# Patient Record
Sex: Female | Born: 1970 | Race: White | Hispanic: No | Marital: Married | Smoking: Never smoker
Health system: Southern US, Community
[De-identification: ages and names within clinical notes are randomized; demographics above are authoritative.]

## PROBLEM LIST (undated history)

## (undated) DIAGNOSIS — I10 Essential (primary) hypertension: Secondary | ICD-10-CM

## (undated) HISTORY — PX: CHOLECYSTECTOMY: SHX55

## (undated) HISTORY — DX: Essential (primary) hypertension: I10

## (undated) HISTORY — PX: CHOLECYSTECTOMY, LAPAROSCOPIC: SHX56

## (undated) HISTORY — PX: COSMETIC SURGERY: SHX468

---

## 2014-03-08 HISTORY — PX: AUGMENTATION MAMMAPLASTY: SUR837

## 2018-04-13 ENCOUNTER — Ambulatory Visit (INDEPENDENT_AMBULATORY_CARE_PROVIDER_SITE_OTHER): Payer: No Typology Code available for payment source | Admitting: Family Medicine

## 2018-04-13 ENCOUNTER — Encounter: Payer: Self-pay | Admitting: Family Medicine

## 2018-04-13 VITALS — BP 160/90 | HR 61 | Temp 97.9°F | Wt 127.6 lb

## 2018-04-13 DIAGNOSIS — Z1239 Encounter for other screening for malignant neoplasm of breast: Secondary | ICD-10-CM

## 2018-04-13 DIAGNOSIS — Z111 Encounter for screening for respiratory tuberculosis: Secondary | ICD-10-CM | POA: Diagnosis not present

## 2018-04-13 DIAGNOSIS — R030 Elevated blood-pressure reading, without diagnosis of hypertension: Secondary | ICD-10-CM | POA: Diagnosis not present

## 2018-04-13 DIAGNOSIS — Z0000 Encounter for general adult medical examination without abnormal findings: Secondary | ICD-10-CM

## 2018-04-13 LAB — BASIC METABOLIC PANEL
BUN: 12 mg/dL (ref 6–23)
CO2: 30 mEq/L (ref 19–32)
Calcium: 9.2 mg/dL (ref 8.4–10.5)
Chloride: 103 mEq/L (ref 96–112)
Creatinine, Ser: 0.74 mg/dL (ref 0.40–1.20)
GFR: 84.68 mL/min (ref >60.00)
Glucose, Bld: 81 mg/dL (ref 70–99)
Potassium: 4.3 mEq/L (ref 3.5–5.1)
Sodium: 137 mEq/L (ref 135–145)

## 2018-04-13 LAB — LIPID PANEL
Cholesterol: 197 mg/dL (ref 0–200)
HDL: 83.9 mg/dL (ref >39.00)
LDL Cholesterol: 101 mg/dL — ABNORMAL HIGH (ref 0–99)
NonHDL: 112.76
Total CHOL/HDL Ratio: 2
Triglycerides: 60 mg/dL (ref 0.0–149.0)
VLDL: 12 mg/dL (ref 0.0–40.0)

## 2018-04-13 LAB — ALT: ALT: 16 U/L (ref 0–35)

## 2018-04-13 LAB — AST: AST: 24 U/L (ref 0–37)

## 2018-04-13 MED ORDER — VALACYCLOVIR HCL 1 G PO TABS: 2000.0000 mg | ORAL_TABLET | Freq: Two times a day (BID) | ORAL | 0 refills | Status: DC

## 2018-04-13 MED FILL — valACYclovir HCL 1 GM TABS: 1 | 7 days supply | Qty: 30 | Fill #0

## 2018-04-13 NOTE — Progress Notes (Signed)
Alexandra Perez is a 48 y.o. female  Chief Complaint  Patient presents with  . Establish Care    CPE-- Fasting/ qferon    HPI:  Alexandra Perez is a 48 y.o. female here as a new patient to our office for her annual physical exam and fasting labs. Previous PCP was Dr. Deneen Harts. She needs to have quantiferon gold test for TB screening for school requirement.   BP is elevated today in the office and pt states when she checks it at work, her BP is high as well. She notes a fam h/o HTN and knows "medication is inevitable". No HA, dizziness, CP, SOB, n/v, LE edema.  Last PAP:  Last mammo: needs referral Last Dexa: n/a Last colonoscopy: n/a  Diet/Exercise: average diet and regular exercise  Med refills needed today? 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 No pertinent past medical history.  History reviewed. No pertinent surgical history.  Social History   Socioeconomic History  . Marital status: Married    Spouse name: Not on file  . Number of children: Not on file  . Years of education: Not on file  . Highest education level: Not on file  Occupational History  . Not on file  Social Needs  . Financial resource strain: Not on file  . Food insecurity:    Worry: Not on file    Inability: Not on file  . Transportation needs:    Medical: Not on file    Non-medical: Not on file  Tobacco Use  . Smoking status: Never Smoker  . Smokeless tobacco: Never Used  Substance and Sexual Activity  . Alcohol use: Yes    Comment: social  . Drug use: Never  . Sexual activity: Not on file  Lifestyle  . Physical activity:    Days per week: Not on file    Minutes per session: Not on file  . Stress: Not on file  Relationships  . Social connections:    Talks on phone: Not on file    Gets together: Not on file    Attends religious service: Not on file    Active member of club or organization: Not on file    Attends meetings of clubs or  organizations: Not on file    Relationship status: Not on file  . Intimate partner violence:    Fear of current or ex partner: Not on file    Emotionally abused: Not on file    Physically abused: Not on file    Forced sexual activity: Not on file  Other Topics Concern  . Not on file  Social History Narrative  . Not on file    Family History  Problem Relation Age of Onset  . Hypertension Mother   . Cancer Paternal Uncle        throat  . Cancer Maternal Grandfather        prostate      There is no immunization history on file for this patient.  No outpatient encounter medications on file as of 04/13/2018.   No facility-administered encounter medications on file as of 04/13/2018.      ROS: Gen: no fever, chills  Skin: no rash, itching ENT: no ear pain, ear drainage, nasal congestion, rhinorrhea, sinus pressure, sore throat Eyes: no blurry vision, double vision Resp: no cough, wheeze,SOB Breast: no breast tenderness, no nipple discharge, no breast masses CV: no CP, palpitations, LE edema,  GI: no heartburn,  n/v/d/c, abd pain GU: no dysuria, urgency, frequency, hematuria; no vaginal itching, odor, discharge MSK: no joint pain, myalgias, back pain Neuro: no dizziness, headache, weakness, vertigo Psych: no depression, some anxiety,no insomnia   No Known Allergies  BP (!) 160/90   Pulse 61   Temp 97.9 F (36.6 C) (Oral)   SpO2 100%   BP Readings from Last 3 Encounters:  04/13/18 (!) 160/90     Physical Exam  Constitutional: She is oriented to person, place, and time. She appears well-developed and well-nourished. No distress.  HENT:  Head: Normocephalic and atraumatic.  Right Ear: Tympanic membrane and ear canal normal.  Left Ear: Tympanic membrane and ear canal normal.  Nose: Nose normal. No mucosal edema or rhinorrhea.  Mouth/Throat: Oropharynx is clear and moist and mucous membranes are normal.  Neck: Neck supple. No thyromegaly present.  Cardiovascular:  Normal rate, regular rhythm and normal heart sounds.  No murmur heard. Pulmonary/Chest: Effort normal and breath sounds normal. No respiratory distress.  Abdominal: Soft. Bowel sounds are normal. She exhibits no distension and no mass. There is no abdominal tenderness.  Musculoskeletal: Normal range of motion.        General: No edema.  Lymphadenopathy:    She has no cervical adenopathy.  Neurological: She is alert and oriented to person, place, and time.  Skin: Skin is warm and dry.  Psychiatric: She has a normal mood and affect. Her behavior is normal.     A/P:  1. Annual physical exam - due for PAP (pt will schedule with GYN) and mammo (referral placed today) - UTD on immunizations - UTD on dental exam - counseled pt about importance of regular CV exercise and healthy diet - ALT - AST - Basic metabolic panel - Lipid panel - form competed for school - next CPE in 1 year  2. Screening for tuberculosis - QuantiFERON-TB Gold Plus  3. Screening for breast cancer - MM DIGITAL SCREENING BILATERAL; Future  4. Elevated BP without diagnosis of hypertension - pt will check BP 3x/wk x 2 weeks and send readings via MyChart. If average is  >140/>90, will start anti-HTN med. Pt is agreeable to this - discussed importance of regular CV exercise and low sodium diet

## 2018-04-15 LAB — QUANTIFERON-TB GOLD PLUS
Mitogen-NIL: 7.26 IU/mL
NIL: 0.03 IU/mL
QuantiFERON-TB Gold Plus: NEGATIVE
TB1-NIL: 0 [IU]/mL
TB2-NIL: 0 [IU]/mL

## 2018-04-18 ENCOUNTER — Other Ambulatory Visit: Payer: Self-pay

## 2018-04-18 ENCOUNTER — Encounter (HOSPITAL_BASED_OUTPATIENT_CLINIC_OR_DEPARTMENT_OTHER): Payer: Self-pay | Admitting: Emergency Medicine

## 2018-04-18 ENCOUNTER — Emergency Department (HOSPITAL_BASED_OUTPATIENT_CLINIC_OR_DEPARTMENT_OTHER)
Admission: EM | Admit: 2018-04-18 | Discharge: 2018-04-18 | Disposition: A | Discharge status: Home / Self Care | Payer: No Typology Code available for payment source | Attending: Emergency Medicine | Admitting: Emergency Medicine

## 2018-04-18 ENCOUNTER — Emergency Department (HOSPITAL_BASED_OUTPATIENT_CLINIC_OR_DEPARTMENT_OTHER): Payer: No Typology Code available for payment source

## 2018-04-18 DIAGNOSIS — Z3202 Encounter for pregnancy test, result negative: Secondary | ICD-10-CM | POA: Insufficient documentation

## 2018-04-18 DIAGNOSIS — K5900 Constipation, unspecified: Secondary | ICD-10-CM | POA: Insufficient documentation

## 2018-04-18 DIAGNOSIS — K6289 Other specified diseases of anus and rectum: Secondary | ICD-10-CM | POA: Insufficient documentation

## 2018-04-18 LAB — CBC
HCT: 39.9 % (ref 36.0–46.0)
Hemoglobin: 12.3 g/dL (ref 12.0–15.0)
MCH: 27.2 pg (ref 26.0–34.0)
MCHC: 30.8 g/dL (ref 30.0–36.0)
MCV: 88.3 fL (ref 80.0–100.0)
Platelets: 298 10*3/uL (ref 150–400)
RBC: 4.52 MIL/uL (ref 3.87–5.11)
RDW: 14 % (ref 11.5–15.5)
WBC: 11.6 10*3/uL — ABNORMAL HIGH (ref 4.0–10.5)
nRBC: 0 % (ref 0.0–0.2)

## 2018-04-18 LAB — LIPASE, BLOOD: Lipase: 31 U/L (ref 11–51)

## 2018-04-18 LAB — URINALYSIS, ROUTINE W REFLEX MICROSCOPIC
Glucose, UA: NEGATIVE mg/dL
Hgb urine dipstick: NEGATIVE
Ketones, ur: 15 mg/dL — AB
Nitrite: NEGATIVE
Protein, ur: NEGATIVE mg/dL
Specific Gravity, Urine: 1.01 (ref 1.005–1.030)
pH: 8.5 — ABNORMAL HIGH (ref 5.0–8.0)

## 2018-04-18 LAB — COMPREHENSIVE METABOLIC PANEL
ALT: 19 U/L (ref 0–44)
AST: 31 U/L (ref 15–41)
Albumin: 4.4 g/dL (ref 3.5–5.0)
Alkaline Phosphatase: 43 U/L (ref 38–126)
Anion gap: 10 (ref 5–15)
BUN: 12 mg/dL (ref 6–20)
CO2: 24 mmol/L (ref 22–32)
CREATININE: 0.64 mg/dL (ref 0.44–1.00)
Calcium: 9.4 mg/dL (ref 8.9–10.3)
Chloride: 101 mmol/L (ref 98–111)
GFR calc non Af Amer: >60 mL/min (ref >60)
Glucose, Bld: 123 mg/dL — ABNORMAL HIGH (ref 70–99)
Potassium: 3.8 mmol/L (ref 3.5–5.1)
Sodium: 135 mmol/L (ref 135–145)
Total Bilirubin: 0.9 mg/dL (ref 0.3–1.2)
Total Protein: 7.9 g/dL (ref 6.5–8.1)

## 2018-04-18 LAB — URINALYSIS, MICROSCOPIC (REFLEX): RBC / HPF: NONE SEEN RBC/hpf (ref 0–5)

## 2018-04-18 LAB — PREGNANCY, URINE: Preg Test, Ur: NEGATIVE

## 2018-04-18 MED ORDER — SODIUM CHLORIDE 0.9% FLUSH
3.0000 mL | Freq: Once | INTRAVENOUS | Status: DC
  Filled 2018-04-18: qty 3

## 2018-04-18 MED ORDER — DOCUSATE SODIUM 250 MG PO CAPS: 250.0000 mg | ORAL_CAPSULE | Freq: Every day | ORAL | 0 refills | Status: DC

## 2018-04-18 MED ORDER — SODIUM CHLORIDE 0.9 % IV BOLUS
1000.0000 mL | Freq: Once | INTRAVENOUS | Status: AC
  Administered 2018-04-18: 1000 mL via INTRAVENOUS

## 2018-04-18 MED ORDER — CIPROFLOXACIN HCL 500 MG PO TABS: 500.0000 mg | ORAL_TABLET | Freq: Two times a day (BID) | ORAL | 0 refills | Status: AC

## 2018-04-18 MED ORDER — IOPAMIDOL (ISOVUE-300) INJECTION 61%
100.0000 mL | Freq: Once | INTRAVENOUS | Status: AC | PRN
  Administered 2018-04-18: 100 mL via INTRAVENOUS

## 2018-04-18 MED ORDER — METRONIDAZOLE 500 MG PO TABS: 500.0000 mg | ORAL_TABLET | Freq: Three times a day (TID) | ORAL | 0 refills | Status: AC

## 2018-04-18 NOTE — ED Provider Notes (Signed)
[facility] EMERGENCY DEPARTMENT Provider Note   CSN: [id number] Arrival date & time: 04/18/18  1354     History   Chief Complaint Chief Complaint  Patient presents with  . Constipation  . Abdominal Pain  . Emesis    HPI Alexandra Perez is a 48 y.o. female who presents to ED for 1 week history of constipation, abdominal cramping.  States that she can go up to 4 days without having a bowel movement.  She noticed that yesterday she had still not had a bowel movement so she took MiraLAX.  She woke up in the middle the night feeling the urge to use the bathroom but only one small hard piece of stool.  She then gave herself an enema which also produced small pieces of stool.  She had one episode of emesis today which was nonbloody, nonbilious and she believes is secondary to her pain.  She denies history of similar symptoms in the past.  She denies any changes to urination.  Prior abdominal surgeries include C-section x2 and cholecystectomy 20 years ago.  She denies any new medication changes or adjustments, changes to her diet.  HPI  History reviewed. No pertinent past medical history.  There are no active problems to display for this patient.   Past Surgical History:  Procedure Laterality Date  . CESAREAN SECTION    . CHOLECYSTECTOMY, LAPAROSCOPIC       OB History   No obstetric history on file.      Home Medications    Prior to Admission medications   Medication Sig Start Date End Date Taking? Authorizing Provider  ciprofloxacin (CIPRO) 500 MG tablet Take 1 tablet (500 mg total) by mouth 2 (two) times daily for 7 days. 04/18/18 04/25/18  Krishan Mcbreen, PA-C  docusate sodium (COLACE) 250 MG capsule Take 1 capsule (250 mg total) by mouth daily. 04/18/18   Nadalyn Deringer, PA-C  metroNIDAZOLE (FLAGYL) 500 MG tablet Take 1 tablet (500 mg total) by mouth 3 (three) times daily for 7 days. 04/18/18 04/25/18  Keeghan Bialy, PA-C  valACYclovir (VALTREX) 1000 MG tablet Take 2 tablets  (2,000 mg total) by mouth 2 (two) times daily. 04/13/18   CiriglianoJearld Lesch, Mary K, DO    Family History Family History  Problem Relation Age of Onset  . Cancer Maternal Grandfather        prostate  . Hypertension Father   . Cancer Maternal Grandmother     Social History Social History   Tobacco Use  . Smoking status: Never Smoker  . Smokeless tobacco: Never Used  Substance Use Topics  . Alcohol use: Yes    Comment: social  . Drug use: Never     Allergies   Patient has no known allergies.   Review of Systems Review of Systems  Constitutional: Negative for appetite change, chills and fever.  HENT: Negative for ear pain, rhinorrhea, sneezing and sore throat.   Eyes: Negative for photophobia and visual disturbance.  Respiratory: Negative for cough, chest tightness, shortness of breath and wheezing.   Cardiovascular: Negative for chest pain and palpitations.  Gastrointestinal: Positive for abdominal pain, constipation and vomiting. Negative for blood in stool, diarrhea and nausea.  Genitourinary: Negative for dysuria, hematuria and urgency.  Musculoskeletal: Negative for myalgias.  Skin: Negative for rash.  Neurological: Negative for dizziness, weakness and light-headedness.     Physical Exam Updated Vital Signs BP 128/84 (BP Location: Right Arm)   Pulse 76   Temp 97.9 F (36.6 C) (Oral)  Resp 20   Ht 5\' 5"  (1.651 m)   Wt 58.7 kg   LMP 04/04/2018   SpO2 99%   BMI 21.54 kg/m   Physical Exam Vitals signs and nursing note reviewed. Exam conducted with a chaperone present.  Constitutional:      General: She is not in acute distress.    Appearance: She is well-developed.  HENT:     Head: Normocephalic and atraumatic.     Nose: Nose normal.  Eyes:     General: No scleral icterus.       Left eye: No discharge.     Conjunctiva/sclera: Conjunctivae normal.  Neck:     Musculoskeletal: Normal range of motion and neck supple.  Cardiovascular:     Rate and Rhythm:  Normal rate and regular rhythm.     Heart sounds: Normal heart sounds. No murmur. No friction rub. No gallop.   Pulmonary:     Effort: Pulmonary effort is normal. No respiratory distress.     Breath sounds: Normal breath sounds.  Abdominal:     General: Bowel sounds are normal. There is no distension.     Palpations: Abdomen is soft.     Tenderness: There is no abdominal tenderness. There is no guarding.  Genitourinary:    Rectum: External hemorrhoid present. No tenderness.     Comments: Soft stool in the rectum.  No impaction noted. Musculoskeletal: Normal range of motion.  Skin:    General: Skin is warm and dry.     Findings: No rash.  Neurological:     Mental Status: She is alert.     Motor: No abnormal muscle tone.     Coordination: Coordination normal.      ED Treatments / Results  Labs (all labs ordered are listed, but only abnormal results are displayed) Labs Reviewed  COMPREHENSIVE METABOLIC PANEL - Abnormal; Notable for the following components:      Result Value   Glucose, Bld 123 (*)    All other components within normal limits  CBC - Abnormal; Notable for the following components:   WBC 11.6 (*)    All other components within normal limits  URINALYSIS, ROUTINE W REFLEX MICROSCOPIC - Abnormal; Notable for the following components:   Color, Urine AMBER (*)    APPearance CLOUDY (*)    pH 8.5 (*)    Bilirubin Urine SMALL (*)    Ketones, ur 15 (*)    Leukocytes,Ua TRACE (*)    All other components within normal limits  URINALYSIS, MICROSCOPIC (REFLEX) - Abnormal; Notable for the following components:   Bacteria, UA RARE (*)    All other components within normal limits  LIPASE, BLOOD  PREGNANCY, URINE    EKG None  Radiology Dg Abdomen 1 View  Result Date: 04/18/2018 CLINICAL DATA:  Constipation sudden onset x last night and one episode UPREG Negative. HX: Cholecystectomy and C-sections. EXAM: ABDOMEN - 1 VIEW COMPARISON:  None. FINDINGS: There is  significant stool throughout nondilated loops of colon. Mild dilatation of small bowel loops in the LEFT UPPER QUADRANT may represent focal ileus. No evidence for free intraperitoneal air on the supine view performed. IMPRESSION: 1. Significant stool burden. 2. Possible focal ileus in the LEFT UPPER QUADRANT QUADRANT. Electronically Signed   By: Norva Pavlov M.D.   On: 04/18/2018 16:13   Ct Abdomen Pelvis W Contrast  Result Date: 04/18/2018 CLINICAL DATA:  Seven days of constipation and abdominal cramping. EXAM: CT ABDOMEN AND PELVIS WITH CONTRAST TECHNIQUE: Multidetector CT imaging  of the abdomen and pelvis was performed using the standard protocol following bolus administration of intravenous contrast. CONTRAST:  100mL ISOVUE-300 IOPAMIDOL (ISOVUE-300) INJECTION 61% COMPARISON:  None. FINDINGS: Lower chest: Mild atelectasis of posterior lung bases are noted. Heart size is normal. Hepatobiliary: No focal liver abnormality is seen. Status post cholecystectomy. Postsurgical dilatation of common hepatic duct is identified measuring 1.7 cm. Pancreas: Unremarkable. No pancreatic ductal dilatation or surrounding inflammatory changes. Spleen: Normal spleen size. Calcified granuloma is noted in the spleen. Adrenals/Urinary Tract: The bilateral adrenal glands are normal. There is a 1.3 cm simple cyst in the upper pole left kidney. No hydronephrosis noted bilaterally. The bladder is normal. Stomach/Bowel: Stomach is within normal limits. Appendix appears normal. There is no small bowel obstruction. Moderate bowel content is identified throughout colon. There is stranding and minimal fluid surrounding the rectum. Vascular/Lymphatic: No significant vascular findings are present. No enlarged abdominal or pelvic lymph nodes. Reproductive: Uterus and bilateral adnexa are unremarkable. Other: No abdominal wall hernia or abnormality. No abdominopelvic ascites. Musculoskeletal: No acute abnormality. IMPRESSION: Mild  stranding and minimal fluid surrounding the rectum. This can be seen in proctitis. Moderate bowel content identified throughout colon. Electronically Signed   By: Sherian ReinWei-Chen  Lin M.D.   On: 04/18/2018 18:25    Procedures Procedures (including critical care time)  Medications Ordered in ED Medications  sodium chloride flush (NS) 0.9 % injection 3 mL (3 mLs Intravenous Not Given 04/18/18 1444)  sodium chloride 0.9 % bolus 1,000 mL (0 mLs Intravenous Stopped 04/18/18 1655)  iopamidol (ISOVUE-300) 61 % injection 100 mL (100 mLs Intravenous Contrast Given 04/18/18 1802)     Initial Impression / Assessment and Plan / ED Course  I have reviewed the triage vital signs and the nursing notes.  Pertinent labs & imaging results that were available during my care of the patient were reviewed by me and considered in my medical decision making (see chart for details).     48 year old female presents to ED for constipation for 1 week.  Reports urge to have a bowel movement but only has small pieces of stool.  She reports vague abdominal cramping as well.  On my exam abdomen is soft, nontender nondistended.  Rectal exam revealed soft stool in the rectal vault with no signs of impaction.  Grossly Hemoccult negative.  Vital signs are within normal limits.  CBC, CMP, urinalysis, lipase unremarkable.  Urine pregnancy is negative.  X-ray shows possible focal ileus in the left upper quadrant, so CT scan of abdomen pelvis was done to rule out obstruction.  She is at risk for obstruction due to her history of abdominal surgeries in the past.  CT shows large amount of stool with findings consistent with proctitis.  She has had 3 successful bowel movement since being here in the ED after digital rectal exam.  Will treat with Colace, increase fiber, Flagyl and Cipro for proctitis. Advised to return to ED for any severe or worsening symptoms.  Patient is hemodynamically stable, in NAD, and able to ambulate in the ED.  Evaluation does not show pathology that would require ongoing emergent intervention or inpatient treatment. I explained the diagnosis to the patient. Pain has been managed and has no complaints prior to discharge. Patient is comfortable with above plan and is stable for discharge at this time. All questions were answered prior to disposition. Strict return precautions for returning to the ED were discussed. Encouraged follow up with PCP.    Portions of this note were generated  with Scientist, clinical (histocompatibility and immunogenetics). Dictation errors may occur despite best attempts at proofreading.  Final Clinical Impressions(s) / ED Diagnoses   Final diagnoses:  Proctitis  Constipation, unspecified constipation type    ED Discharge Orders         Ordered    docusate sodium (COLACE) 250 MG capsule  Daily     04/18/18 1933    metroNIDAZOLE (FLAGYL) 500 MG tablet  3 times daily     04/18/18 1933    ciprofloxacin (CIPRO) 500 MG tablet  2 times daily     04/18/18 1933           Dietrich Pates, Cordelia Poche 04/18/18 1936    Alvira Monday, MD 04/22/18 [id number]

## 2018-04-18 NOTE — Discharge Instructions (Signed)
Return to the ED if you start to have worsening symptoms, severe abdominal pain, blood in your stool, vomiting up blood, lightheadedness.

## 2018-04-18 NOTE — ED Triage Notes (Signed)
Pt with 7 days of constipation and abdominal cramping. Also with emesis, pt very uncomfortable at triage. Has been using miralax but no relief.

## 2018-04-18 NOTE — ED Notes (Signed)
ED Provider at bedside. 

## 2018-04-18 NOTE — ED Notes (Signed)
Patient was asked if they could provide a urine sample at this time. Patient is unable to at this time.

## 2018-04-19 MED FILL — metroNIDAZOLE 500 MG TABS: 500 | 7 days supply | Qty: 21 | Fill #0

## 2018-04-19 MED FILL — CIPROFLOXACIN HCL 500 MG TA: 500 | 7 days supply | Qty: 14 | Fill #0

## 2018-05-05 ENCOUNTER — Telehealth: Payer: No Typology Code available for payment source | Admitting: Family

## 2018-05-05 DIAGNOSIS — J069 Acute upper respiratory infection, unspecified: Secondary | ICD-10-CM

## 2018-05-05 MED ORDER — FLUTICASONE PROPIONATE 50 MCG/ACT NA SUSP: 1.0000 | Freq: Two times a day (BID) | NASAL | 6 refills | Status: DC

## 2018-05-05 MED ORDER — BENZONATATE 100 MG PO CAPS: 100.0000 mg | ORAL_CAPSULE | Freq: Three times a day (TID) | ORAL | 0 refills | Status: DC | PRN

## 2018-05-05 MED FILL — FLUTICASONE PROP 50 MCG SPR: 50 | 30 days supply | Qty: 16 | Fill #0

## 2018-05-05 MED FILL — BENZONATATE 100 MG CAP: 100 | 5 days supply | Qty: 30 | Fill #0

## 2018-05-05 NOTE — Progress Notes (Signed)
Greater than 5 minutes, yet less than 10 minutes of time have been spent researching, coordinating, and implementing care for this patient today.  Thank you for the details you included in the comment boxes. Those details are very helpful in determining the best course of treatment for you and help us to provide the best care.  We are sorry you are not feeling well.  Here is how we plan to help!  Based on what you have shared with me, it looks like you may have a viral upper respiratory infection.  Upper respiratory infections are caused by a large number of viruses; however, rhinovirus is the most common cause.   Symptoms vary from person to person, with common symptoms including sore throat, cough, and fatigue or lack of energy.  A low-grade fever of up to 100.4 may present, but is often uncommon.  Symptoms vary however, and are closely related to a person's age or underlying illnesses.  The most common symptoms associated with an upper respiratory infection are nasal discharge or congestion, cough, sneezing, headache and pressure in the ears and face.  These symptoms usually persist for about 3 to 10 days, but can last up to 2 weeks.  It is important to know that upper respiratory infections do not cause serious illness or complications in most cases.    Upper respiratory infections can be transmitted from person to person, with the most common method of transmission being a person's hands.  The virus is able to live on the skin and can infect other persons for up to 2 hours after direct contact.  Also, these can be transmitted when someone coughs or sneezes; thus, it is important to cover the mouth to reduce this risk.  To keep the spread of the illness at bay, good hand hygiene is very important.  This is an infection that is most likely caused by a virus. There are no specific treatments other than to help you with the symptoms until the infection runs its course.  We are sorry you are not feeling  well.  Here is how we plan to help!   For nasal congestion, you may use an oral decongestants such as Mucinex D or if you have glaucoma or high blood pressure use plain Mucinex.  Saline nasal spray or nasal drops can help and can safely be used as often as needed for congestion.  For your congestion, I have prescribed Fluticasone nasal spray one spray in each nostril twice a day  If you do not have a history of heart disease, hypertension, diabetes or thyroid disease, prostate/bladder issues or glaucoma, you may also use Sudafed to treat nasal congestion.  It is highly recommended that you consult with a pharmacist or your primary care physician to ensure this medication is safe for you to take.     If you have a cough, you may use cough suppressants such as Delsym and Robitussin.  If you have glaucoma or high blood pressure, you can also use Coricidin HBP.   For cough I have prescribed for you A prescription cough medication called Tessalon Perles 100 mg. You may take 1-2 capsules every 8 hours as needed for cough  If you have a sore or scratchy throat, use a saltwater gargle-  to  teaspoon of salt dissolved in a 4-ounce to 8-ounce glass of warm water.  Gargle the solution for approximately 15-30 seconds and then spit.  It is important not to swallow the solution.  You can also   use throat lozenges/cough drops and Chloraseptic spray to help with throat pain or discomfort.  Warm or cold liquids can also be helpful in relieving throat pain.  For headache, pain or general discomfort, you can use Ibuprofen or Tylenol as directed.   Some authorities believe that zinc sprays or the use of Echinacea may shorten the course of your symptoms.   HOME CARE . Only take medications as instructed by your medical team. . Be sure to drink plenty of fluids. Water is fine as well as fruit juices, sodas and electrolyte beverages. You may want to stay away from caffeine or alcohol. If you are nauseated, try taking  small sips of liquids. How do you know if you are getting enough fluid? Your urine should be a pale yellow or almost colorless. . Get rest. . Taking a steamy shower or using a humidifier may help nasal congestion and ease sore throat pain. You can place a towel over your head and breathe in the steam from hot water coming from a faucet. . Using a saline nasal spray works much the same way. . Cough drops, hard candies and sore throat lozenges may ease your cough. . Avoid close contacts especially the very young and the elderly . Cover your mouth if you cough or sneeze . Always remember to wash your hands.   GET HELP RIGHT AWAY IF: . You develop worsening fever. . If your symptoms do not improve within 10 days . You develop yellow or green discharge from your nose over 3 days. . You have coughing fits . You develop a severe head ache or visual changes. . You develop shortness of breath, difficulty breathing or start having chest pain . Your symptoms persist after you have completed your treatment plan  MAKE SURE YOU   Understand these instructions.  Will watch your condition.  Will get help right away if you are not doing well or get worse.  Your e-visit answers were reviewed by a board certified advanced clinical practitioner to complete your personal care plan. Depending upon the condition, your plan could have included both over the counter or prescription medications. Please review your pharmacy choice. If there is a problem, you may call our nursing hot line at and have the prescription routed to another pharmacy. Your safety is important to us. If you have drug allergies check your prescription carefully.   You can use MyChart to ask questions about today's visit, request a non-urgent call back, or ask for a work or school excuse for 24 hours related to this e-Visit. If it has been greater than 24 hours you will need to follow up with your provider, or enter a new e-Visit to address  those concerns. You will get an e-mail in the next two days asking about your experience.  I hope that your e-visit has been valuable and will speed your recovery. Thank you for using e-visits.      

## 2018-05-11 ENCOUNTER — Other Ambulatory Visit: Payer: Self-pay | Admitting: Family Medicine

## 2018-05-11 DIAGNOSIS — H5789 Other specified disorders of eye and adnexa: Secondary | ICD-10-CM

## 2018-05-16 MED FILL — FLUOROMETHOLONE 0.1% DROPS: 0.1 | 25 days supply | Qty: 5 | Fill #0

## 2018-05-17 ENCOUNTER — Other Ambulatory Visit: Payer: Self-pay | Admitting: Family Medicine

## 2018-05-17 ENCOUNTER — Ambulatory Visit
Admission: RE | Admit: 2018-05-17 | Discharge: 2018-05-17 | Disposition: A | Discharge status: Home / Self Care | Payer: No Typology Code available for payment source | Source: Ambulatory Visit | Attending: Family Medicine | Admitting: Family Medicine

## 2018-05-17 ENCOUNTER — Other Ambulatory Visit: Payer: Self-pay

## 2018-05-17 DIAGNOSIS — Z1239 Encounter for other screening for malignant neoplasm of breast: Secondary | ICD-10-CM

## 2018-05-31 ENCOUNTER — Telehealth: Payer: No Typology Code available for payment source | Admitting: Nurse Practitioner

## 2018-05-31 DIAGNOSIS — J0100 Acute maxillary sinusitis, unspecified: Secondary | ICD-10-CM

## 2018-05-31 MED ORDER — AMOXICILLIN-POT CLAVULANATE 875-125 MG PO TABS: 1.0000 | ORAL_TABLET | Freq: Two times a day (BID) | ORAL | 0 refills | Status: DC

## 2018-05-31 MED FILL — AMOX-CLAV 875-125 MG TABLET: 875-125 | 7 days supply | Qty: 14 | Fill #0

## 2018-05-31 NOTE — Progress Notes (Signed)
We are sorry that you are not feeling well.  Here is how we plan to help!  Based on what you have shared with me it looks like you have sinusitis.  Sinusitis is inflammation and infection in the sinus cavities of the head.  Based on your presentation I believe you most likely have Acute Bacterial Sinusitis.  This is an infection caused by bacteria and is treated with antibiotics. I have prescribed Augmentin 875mg/125mg one tablet twice daily with food, for 7 days. You may use an oral decongestant such as Mucinex D or if you have glaucoma or high blood pressure use plain Mucinex. Saline nasal spray help and can safely be used as often as needed for congestion.  If you develop worsening sinus pain, fever or notice severe headache and vision changes, or if symptoms are not better after completion of antibiotic, please schedule an appointment with a health care provider.    Sinus infections are not as easily transmitted as other respiratory infection, however we still recommend that you avoid close contact with loved ones, especially the very young and elderly.  Remember to wash your hands thoroughly throughout the day as this is the number one way to prevent the spread of infection!  Home Care:  Only take medications as instructed by your medical team.  Complete the entire course of an antibiotic.  Do not take these medications with alcohol.  A steam or ultrasonic humidifier can help congestion.  You can place a towel over your head and breathe in the steam from hot water coming from a faucet.  Avoid close contacts especially the very young and the elderly.  Cover your mouth when you cough or sneeze.  Always remember to wash your hands.  Get Help Right Away If:  You develop worsening fever or sinus pain.  You develop a severe head ache or visual changes.  Your symptoms persist after you have completed your treatment plan.  Make sure you  Understand these instructions.  Will watch your  condition.  Will get help right away if you are not doing well or get worse.  Your e-visit answers were reviewed by a board certified advanced clinical practitioner to complete your personal care plan.  Depending on the condition, your plan could have included both over the counter or prescription medications.  If there is a problem please reply  once you have received a response from your provider.  Your safety is important to us.  If you have drug allergies check your prescription carefully.    You can use MyChart to ask questions about today's visit, request a non-urgent call back, or ask for a work or school excuse for 24 hours related to this e-Visit. If it has been greater than 24 hours you will need to follow up with your provider, or enter a new e-Visit to address those concerns.  You will get an e-mail in the next two days asking about your experience.  I hope that your e-visit has been valuable and will speed your recovery. Thank you for using e-visits.   5 minutes spent reviewing and documenting in chart.  

## 2018-09-06 MED FILL — AMOXICILLIN 500 MG CAPSULE: 500 | 6 days supply | Qty: 22 | Fill #0

## 2018-09-06 MED FILL — HYDROCODON-APAP 5-325: 5-325 | 3 days supply | Qty: 20 | Fill #0

## 2018-09-06 MED FILL — CHLORHEXIDINE 0.12% RINSE: 0.12 | 16 days supply | Qty: 473 | Fill #0

## 2018-10-17 ENCOUNTER — Telehealth: Payer: No Typology Code available for payment source | Admitting: Family

## 2018-10-17 ENCOUNTER — Other Ambulatory Visit: Payer: Self-pay

## 2018-10-17 DIAGNOSIS — Z20822 Contact with and (suspected) exposure to covid-19: Secondary | ICD-10-CM

## 2018-10-17 NOTE — Progress Notes (Signed)
E-Visit for Corona Virus Screening   Your current symptoms could be consistent with the coronavirus.  Many health care providers can now test patients at their office but not all are.  [facility] has multiple testing sites. For information on our COVID testing locations and hours go to [email or web address]  Please quarantine yourself while awaiting your test results.  We are enrolling you in our MyChart Home Montioring for COVID19 . Daily you will receive a questionnaire within the MyChart website. Our COVID 19 response team willl be monitoriing your responses daily.    COVID-19 is a respiratory illness with symptoms that are similar to the flu. Symptoms are typically mild to moderate, but there have been cases of severe illness and death due to the virus. The following symptoms may appear 2-14 days after exposure: . Fever . Cough . Shortness of breath or difficulty breathing . Chills . Repeated shaking with chills . Muscle pain . Headache . Sore throat . New loss of taste or smell . Fatigue . Congestion or runny nose . Nausea or vomiting . Diarrhea  It is vitally important that if you feel that you have an infection such as this virus or any other virus that you stay home and away from places where you may spread it to others.  You should self-quarantine for 14 days if you have symptoms that could potentially be coronavirus or have been in close contact a with a person diagnosed with COVID-19 within the last 2 weeks. You should avoid contact with people age 65 and older.   You should wear a mask or cloth face covering over your nose and mouth if you must be around other people or animals, including pets (even at home). Try to stay at least 6 feet away from other people. This will protect the people around you.  You may also take acetaminophen (Tylenol) as needed for fever.   Reduce your risk of any infection by using the same precautions used for avoiding the  common cold or flu:  . Wash your hands often with soap and warm water for at least 20 seconds.  If soap and water are not readily available, use an alcohol-based hand sanitizer with at least 60% alcohol.  . If coughing or sneezing, cover your mouth and nose by coughing or sneezing into the elbow areas of your shirt or coat, into a tissue or into your sleeve (not your hands). . Avoid shaking hands with others and consider head nods or verbal greetings only. . Avoid touching your eyes, nose, or mouth with unwashed hands.  . Avoid close contact with people who are sick. . Avoid places or events with large numbers of people in one location, like concerts or sporting events. . Carefully consider travel plans you have or are making. . If you are planning any travel outside or inside the [place], visit the CDC's Travelers' Health webpage for the latest health notices. . If you have some symptoms but not all symptoms, continue to monitor at home and seek medical attention if your symptoms worsen. . If you are having a medical emergency, call 911.  HOME CARE . Only take medications as instructed by your medical team. . Drink plenty of fluids and get plenty of rest. . A steam or ultrasonic humidifier can help if you have congestion.   GET HELP RIGHT AWAY IF YOU HAVE EMERGENCY WARNING SIGNS** FOR COVID-19. If you or someone is showing any of these signs seek emergency medical care immediately. Call   911 or proceed to your closest emergency facility if: . You develop worsening high fever. . Trouble breathing . Bluish lips or face . Persistent pain or pressure in the chest . New confusion . Inability to wake or stay awake . You cough up blood. . Your symptoms become more severe  **This list is not all possible symptoms. Contact your medical provider for any symptoms that are sever or concerning to you.   MAKE SURE YOU   Understand these instructions.  Will watch your condition.  Will get help right  away if you are not doing well or get worse.  Your e-visit answers were reviewed by a board certified advanced clinical practitioner to complete your personal care plan.  Depending on the condition, your plan could have included both over the counter or prescription medications.  If there is a problem please reply once you have received a response from your provider.  Your safety is important to us.  If you have drug allergies check your prescription carefully.    You can use MyChart to ask questions about today's visit, request a non-urgent call back, or ask for a work or school excuse for 24 hours related to this e-Visit. If it has been greater than 24 hours you will need to follow up with your provider, or enter a new e-Visit to address those concerns. You will get an e-mail in the next two days asking about your experience.  I hope that your e-visit has been valuable and will speed your recovery. Thank you for using e-visits.   Greater than 5 minutes, yet less than 10 minutes of time have been spent researching, coordinating, and implementing care for this patient today.  Thank you for the details you included in the comment boxes. Those details are very helpful in determining the best course of treatment for you and help us to provide the best care.  

## 2018-10-18 LAB — NOVEL CORONAVIRUS, NAA: SARS-CoV-2, NAA: NOT DETECTED

## 2018-10-19 ENCOUNTER — Telehealth: Payer: No Typology Code available for payment source | Admitting: Family

## 2018-10-19 DIAGNOSIS — B9689 Other specified bacterial agents as the cause of diseases classified elsewhere: Secondary | ICD-10-CM

## 2018-10-19 DIAGNOSIS — J019 Acute sinusitis, unspecified: Secondary | ICD-10-CM

## 2018-10-19 MED ORDER — AMOXICILLIN-POT CLAVULANATE 875-125 MG PO TABS: 1.0000 | ORAL_TABLET | Freq: Two times a day (BID) | ORAL | 0 refills | Status: DC

## 2018-10-19 MED FILL — AMOX-CLAV 875-125 MG TABLET: 875-125 | 7 days supply | Qty: 14 | Fill #0

## 2018-10-19 NOTE — Progress Notes (Signed)

## 2019-02-27 ENCOUNTER — Ambulatory Visit (INDEPENDENT_AMBULATORY_CARE_PROVIDER_SITE_OTHER): Payer: No Typology Code available for payment source | Admitting: Behavioral Health

## 2019-02-27 ENCOUNTER — Other Ambulatory Visit: Payer: Self-pay

## 2019-02-27 DIAGNOSIS — Z23 Encounter for immunization: Secondary | ICD-10-CM

## 2019-02-27 NOTE — Progress Notes (Signed)
Patient presents in clinic today for Tdap vaccination. IM injection was given in the right deltoid. Patient tolerated the injection well. No signs or symptoms of a reaction were noted prior to patient leaving the nurse visit.

## 2019-03-12 ENCOUNTER — Other Ambulatory Visit: Payer: Self-pay | Admitting: Family Medicine

## 2019-03-13 ENCOUNTER — Encounter: Payer: Self-pay | Admitting: Family Medicine

## 2019-03-13 ENCOUNTER — Other Ambulatory Visit: Payer: Self-pay | Admitting: Family Medicine

## 2019-03-13 ENCOUNTER — Other Ambulatory Visit: Payer: Self-pay

## 2019-03-13 MED ORDER — VALACYCLOVIR HCL 1 G PO TABS: 2000.0000 mg | ORAL_TABLET | Freq: Two times a day (BID) | ORAL | 0 refills | Status: DC

## 2019-03-13 MED FILL — valACYclovir HCL 1 GM TABS: 1 | 8 days supply | Qty: 30 | Fill #0

## 2019-04-11 ENCOUNTER — Encounter: Payer: Self-pay | Admitting: Family Medicine

## 2019-04-11 DIAGNOSIS — Z111 Encounter for screening for respiratory tuberculosis: Secondary | ICD-10-CM

## 2019-04-11 NOTE — Telephone Encounter (Signed)
Please see message and advise.  Thank you. ° °

## 2019-04-12 ENCOUNTER — Other Ambulatory Visit: Payer: Self-pay

## 2019-04-13 ENCOUNTER — Other Ambulatory Visit (INDEPENDENT_AMBULATORY_CARE_PROVIDER_SITE_OTHER): Payer: No Typology Code available for payment source

## 2019-04-13 DIAGNOSIS — Z111 Encounter for screening for respiratory tuberculosis: Secondary | ICD-10-CM | POA: Diagnosis not present

## 2019-04-15 LAB — QUANTIFERON-TB GOLD PLUS
Mitogen-NIL: >10 IU/mL
NIL: 2.23 IU/mL
QuantiFERON-TB Gold Plus: NEGATIVE
TB1-NIL: 0.12 IU/mL
TB2-NIL: 0.17 IU/mL

## 2019-07-27 ENCOUNTER — Other Ambulatory Visit: Payer: Self-pay

## 2019-07-27 ENCOUNTER — Ambulatory Visit (INDEPENDENT_AMBULATORY_CARE_PROVIDER_SITE_OTHER): Payer: No Typology Code available for payment source | Admitting: Family Medicine

## 2019-07-27 ENCOUNTER — Encounter: Payer: Self-pay | Admitting: Family Medicine

## 2019-07-27 VITALS — BP 128/78 | HR 55 | Temp 97.4°F | Ht 65.0 in | Wt 133.0 lb

## 2019-07-27 DIAGNOSIS — Z1231 Encounter for screening mammogram for malignant neoplasm of breast: Secondary | ICD-10-CM

## 2019-07-27 DIAGNOSIS — Z1283 Encounter for screening for malignant neoplasm of skin: Secondary | ICD-10-CM

## 2019-07-27 DIAGNOSIS — Z0000 Encounter for general adult medical examination without abnormal findings: Secondary | ICD-10-CM | POA: Diagnosis not present

## 2019-07-27 DIAGNOSIS — L989 Disorder of the skin and subcutaneous tissue, unspecified: Secondary | ICD-10-CM

## 2019-07-27 DIAGNOSIS — Z1211 Encounter for screening for malignant neoplasm of colon: Secondary | ICD-10-CM

## 2019-07-27 LAB — CBC
HCT: 38.3 % (ref 36.0–46.0)
Hemoglobin: 13.2 g/dL (ref 12.0–15.0)
MCHC: 34.4 g/dL (ref 30.0–36.0)
MCV: 93 fl (ref 78.0–100.0)
Platelets: 225 10*3/uL (ref 150.0–400.0)
RBC: 4.12 Mil/uL (ref 3.87–5.11)
RDW: 12.7 % (ref 11.5–15.5)
WBC: 7.3 10*3/uL (ref 4.0–10.5)

## 2019-07-27 LAB — LIPID PANEL
Cholesterol: 166 mg/dL (ref 0–200)
HDL: 68.3 mg/dL (ref >39.00)
LDL Cholesterol: 84 mg/dL (ref 0–99)
NonHDL: 97.44
Total CHOL/HDL Ratio: 2
Triglycerides: 66 mg/dL (ref 0.0–149.0)
VLDL: 13.2 mg/dL (ref 0.0–40.0)

## 2019-07-27 LAB — BASIC METABOLIC PANEL
BUN: 12 mg/dL (ref 6–23)
CO2: 28 mEq/L (ref 19–32)
Calcium: 9.3 mg/dL (ref 8.4–10.5)
Chloride: 104 mEq/L (ref 96–112)
Creatinine, Ser: 0.77 mg/dL (ref 0.40–1.20)
GFR: 80.43 mL/min (ref >60.00)
Glucose, Bld: 100 mg/dL — ABNORMAL HIGH (ref 70–99)
Potassium: 4.3 mEq/L (ref 3.5–5.1)
Sodium: 136 mEq/L (ref 135–145)

## 2019-07-27 LAB — ALT: ALT: 9 U/L (ref 0–35)

## 2019-07-27 LAB — VITAMIN D 25 HYDROXY (VIT D DEFICIENCY, FRACTURES): VITD: 46.8 ng/mL (ref 30.00–100.00)

## 2019-07-27 LAB — AST: AST: 22 U/L (ref 0–37)

## 2019-07-27 NOTE — Progress Notes (Signed)
Alexandra Perez is a 49 y.o. female  Chief Complaint  Patient presents with  . Annual Exam    Patient is here today for a CPE.pt is fasting//no HIV//no pap-has OBGYN-[facility]    HPI: Alexandra Perez is a 49 y.o. female here for CPE, fasting labs. She follows with GYN.   Last PAP: due and will schedule Last mammo: 05/2018 - needs refill Last colonoscopy: never  Diet/Exercise: healthy diet, regular/daily exercise Dentist: UTD Vision: UTD  Med refills needed today? none   History reviewed. No pertinent past medical history.  Past Surgical History:  Procedure Laterality Date  . AUGMENTATION MAMMAPLASTY Bilateral 2016  . CESAREAN SECTION    . CHOLECYSTECTOMY, LAPAROSCOPIC      Social History   Socioeconomic History  . Marital status: Married    Spouse name: Not on file  . Number of children: Not on file  . Years of education: Not on file  . Highest education level: Not on file  Occupational History  . Not on file  Tobacco Use  . Smoking status: Never Smoker  . Smokeless tobacco: Never Used  Substance and Sexual Activity  . Alcohol use: Yes    Comment: social  . Drug use: Never  . Sexual activity: Not on file  Other Topics Concern  . Not on file  Social History Narrative  . Not on file   Social Determinants of Health   Financial Resource Strain:   . Difficulty of Paying Living Expenses:   Food Insecurity:   . Worried About Programme researcher, broadcasting/film/video in the Last Year:   . Barista in the Last Year:   Transportation Needs:   . Freight forwarder (Medical):   Marland Kitchen Lack of Transportation (Non-Medical):   Physical Activity:   . Days of Exercise per Week:   . Minutes of Exercise per Session:   Stress:   . Feeling of Stress :   Social Connections:   . Frequency of Communication with Friends and Family:   . Frequency of Social Gatherings with Friends and Family:   . Attends Religious Services:   . Active Member of Clubs or Organizations:   . Attends  Banker Meetings:   Marland Kitchen Marital Status:   Intimate Partner Violence:   . Fear of Current or Ex-Partner:   . Emotionally Abused:   Marland Kitchen Physically Abused:   . Sexually Abused:     Family History  Problem Relation Age of Onset  . Cancer Maternal Grandfather        prostate  . Hypertension Father   . Cancer Maternal Grandmother      Immunization History  Administered Date(s) Administered  . Influenza-Unspecified 12/06/2017  . PPD Test 04/16/2015, 04/29/2015, 04/20/2016  . Td 06/06/2009  . Tdap 02/27/2019    Outpatient Encounter Medications as of 07/27/2019  Medication Sig  . amoxicillin-clavulanate (AUGMENTIN) 875-125 MG tablet Take 1 tablet by mouth 2 (two) times daily. (Patient not taking: Reported on 07/27/2019)  . benzonatate (TESSALON PERLES) 100 MG capsule Take 1-2 capsules (100-200 mg total) by mouth every 8 (eight) hours as needed for cough. (Patient not taking: Reported on 07/27/2019)  . docusate sodium (COLACE) 250 MG capsule Take 1 capsule (250 mg total) by mouth daily. (Patient not taking: Reported on 07/27/2019)  . fluticasone (FLONASE) 50 MCG/ACT nasal spray Place 1 spray into both nostrils 2 (two) times daily. (Patient not taking: Reported on 07/27/2019)  . valACYclovir (VALTREX) 1000 MG tablet Take  2 tablets (2,000 mg total) by mouth 2 (two) times daily. (Patient not taking: Reported on 07/27/2019)   No facility-administered encounter medications on file as of 07/27/2019.     ROS: Gen: no fever, chills  Skin: no rash, itching ENT: no ear pain, ear drainage, nasal congestion, rhinorrhea, sinus pressure, sore throat Eyes: no blurry vision, double vision Resp: no cough, wheeze,SOB CV: no CP, palpitations, LE edema,  GI: no heartburn, n/v/d/c, abd pain GU: no dysuria, urgency, frequency, hematuria MSK: no joint pain, myalgias, back pain Neuro: no dizziness, headache, weakness, vertigo Psych: no depression, anxiety, insomnia   No Known Allergies  BP  128/78   Pulse (!) 55   Temp (!) 97.4 F (36.3 C) (Tympanic)   Ht 5\' 5"  (1.651 m)   Wt 133 lb (60.3 kg)   LMP 07/14/2019   SpO2 100%   BMI 22.13 kg/m    Pulse Readings from Last 3 Encounters:  07/27/19 (!) 55  04/18/18 76  04/13/18 61     Physical Exam  Constitutional: She is oriented to person, place, and time. She appears well-developed and well-nourished. No distress.  HENT:  Head: Normocephalic and atraumatic.  Right Ear: Tympanic membrane and ear canal normal.  Left Ear: Tympanic membrane and ear canal normal.  Nose: Nose normal.  Mouth/Throat: Oropharynx is clear and moist and mucous membranes are normal.  Eyes: Pupils are equal, round, and reactive to light. Conjunctivae are normal.  Neck: No thyromegaly present.  Cardiovascular: Normal rate, regular rhythm, normal heart sounds and intact distal pulses.  No murmur heard. Pulmonary/Chest: Effort normal and breath sounds normal. No respiratory distress. She has no wheezes. She has no rhonchi.  Abdominal: Soft. Bowel sounds are normal. She exhibits no distension and no mass. There is no abdominal tenderness.  Musculoskeletal:        General: No edema.     Cervical back: Neck supple.  Lymphadenopathy:    She has no cervical adenopathy.  Neurological: She is alert and oriented to person, place, and time. She exhibits normal muscle tone. Coordination normal.  Skin: Skin is warm and dry.  Psychiatric: She has a normal mood and affect. Her behavior is normal.     A/P:   1. Annual physical exam - discussed importance of regular CV exercise, healthy diet, adequate sleep - UTD on dental and vision exams - UTD on immunizations - due for mammo, colonoscopy - referrals placed today - pt will schedule PAP with GYN - ALT - AST - Basic metabolic panel - CBC - Lipid panel - VITAMIN D 25 Hydroxy (Vit-D Deficiency, Fractures) - next CPE in 1 year  2. Screening for colon cancer - Ambulatory referral to  Gastroenterology  3. Visit for screening mammogram - MM DIGITAL SCREENING BILATERAL; Future  4. Scalp lesion 5. Screening for skin cancer - Ambulatory referral to Dermatology     This visit occurred during the SARS-CoV-2 public health emergency.  Safety protocols were in place, including screening questions prior to the visit, additional usage of staff PPE, and extensive cleaning of exam room while observing appropriate contact time as indicated for disinfecting solutions.

## 2019-07-27 NOTE — Patient Instructions (Signed)
Health Maintenance, Female Adopting a healthy lifestyle and getting preventive care are important in promoting health and wellness. Ask your health care provider about:  The right schedule for you to have regular tests and exams.  Things you can do on your own to prevent diseases and keep yourself healthy. What should I know about diet, weight, and exercise? Eat a healthy diet   Eat a diet that includes plenty of vegetables, fruits, low-fat dairy products, and lean protein.  Do not eat a lot of foods that are high in solid fats, added sugars, or sodium. Maintain a healthy weight Body mass index (BMI) is used to identify weight problems. It estimates body fat based on height and weight. Your health care provider can help determine your BMI and help you achieve or maintain a healthy weight. Get regular exercise Get regular exercise. This is one of the most important things you can do for your health. Most adults should:  Exercise for at least 150 minutes each week. The exercise should increase your heart rate and make you sweat (moderate-intensity exercise).  Do strengthening exercises at least twice a week. This is in addition to the moderate-intensity exercise.  Spend less time sitting. Even light physical activity can be beneficial. Watch cholesterol and blood lipids Have your blood tested for lipids and cholesterol at 47 years of age, then have this test every 5 years. Have your cholesterol levels checked more often if:  Your lipid or cholesterol levels are high.  You are older than 47 years of age.  You are at high risk for heart disease. What should I know about cancer screening? Depending on your health history and family history, you may need to have cancer screening at various ages. This may include screening for:  Breast cancer.  Cervical cancer.  Colorectal cancer.  Skin cancer.  Lung cancer. What should I know about heart disease, diabetes, and high blood  pressure? Blood pressure and heart disease  High blood pressure causes heart disease and increases the risk of stroke. This is more likely to develop in people who have high blood pressure readings, are of African descent, or are overweight.  Have your blood pressure checked: ? Every 3-5 years if you are 18-39 years of age. ? Every year if you are 40 years old or older. Diabetes Have regular diabetes screenings. This checks your fasting blood sugar level. Have the screening done:  Once every three years after age 40 if you are at a normal weight and have a low risk for diabetes.  More often and at a younger age if you are overweight or have a high risk for diabetes. What should I know about preventing infection? Hepatitis B If you have a higher risk for hepatitis B, you should be screened for this virus. Talk with your health care provider to find out if you are at risk for hepatitis B infection. Hepatitis C Testing is recommended for:  Everyone born from 1945 through 1965.  Anyone with known risk factors for hepatitis C. Sexually transmitted infections (STIs)  Get screened for STIs, including gonorrhea and chlamydia, if: ? You are sexually active and are younger than 47 years of age. ? You are older than 47 years of age and your health care provider tells you that you are at risk for this type of infection. ? Your sexual activity has changed since you were last screened, and you are at increased risk for chlamydia or gonorrhea. Ask your health care provider if   you are at risk.  Ask your health care provider about whether you are at high risk for HIV. Your health care provider may recommend a prescription medicine to help prevent HIV infection. If you choose to take medicine to prevent HIV, you should first get tested for HIV. You should then be tested every 3 months for as long as you are taking the medicine. Pregnancy  If you are about to stop having your period (premenopausal) and  you may become pregnant, seek counseling before you get pregnant.  Take 400 to 800 micrograms (mcg) of folic acid every day if you become pregnant.  Ask for birth control (contraception) if you want to prevent pregnancy. Osteoporosis and menopause Osteoporosis is a disease in which the bones lose minerals and strength with aging. This can result in bone fractures. If you are 65 years old or older, or if you are at risk for osteoporosis and fractures, ask your health care provider if you should:  Be screened for bone loss.  Take a calcium or vitamin D supplement to lower your risk of fractures.  Be given hormone replacement therapy (HRT) to treat symptoms of menopause. Follow these instructions at home: Lifestyle  Do not use any products that contain nicotine or tobacco, such as cigarettes, e-cigarettes, and chewing tobacco. If you need help quitting, ask your health care provider.  Do not use street drugs.  Do not share needles.  Ask your health care provider for help if you need support or information about quitting drugs. Alcohol use  Do not drink alcohol if: ? Your health care provider tells you not to drink. ? You are pregnant, may be pregnant, or are planning to become pregnant.  If you drink alcohol: ? Limit how much you use to 0-1 drink a day. ? Limit intake if you are breastfeeding.  Be aware of how much alcohol is in your drink. In the [place] one drink equals one 12 oz bottle of beer (355 mL), one 5 oz glass of wine (148 mL), or one 1 oz glass of hard liquor (44 mL). General instructions  Schedule regular health, dental, and eye exams.  Stay current with your vaccines.  Tell your health care provider if: ? You often feel depressed. ? You have ever been abused or do not feel safe at home. Summary  Adopting a healthy lifestyle and getting preventive care are important in promoting health and wellness.  Follow your health care provider's instructions about healthy  diet, exercising, and getting tested or screened for diseases.  Follow your health care provider's instructions on monitoring your cholesterol and blood pressure. This information is not intended to replace advice given to you by your health care provider. Make sure you discuss any questions you have with your health care provider. Document Revised: 02/15/2018 Document Reviewed: 02/15/2018 Elsevier Patient Education  2020 [employer] Inc.  

## 2019-08-14 ENCOUNTER — Telehealth: Payer: Self-pay | Admitting: Physician Assistant

## 2019-08-14 NOTE — Telephone Encounter (Signed)
Patient called for appointment.  Referral-[facility] @ [facility] for scalp lesion.  Appointment given for 11/15/2019 @ 2:00 with Mackey Birchwood, PA-C.

## 2019-08-14 NOTE — Telephone Encounter (Signed)
Forward to [facility] sent in error

## 2019-08-29 ENCOUNTER — Telehealth: Payer: No Typology Code available for payment source | Admitting: Family

## 2019-08-29 DIAGNOSIS — J069 Acute upper respiratory infection, unspecified: Secondary | ICD-10-CM

## 2019-08-29 MED ORDER — FLUTICASONE PROPIONATE 50 MCG/ACT NA SUSP: 2.0000 | Freq: Every day | NASAL | 6 refills | Status: DC

## 2019-08-29 MED FILL — FLUTICASONE PROP 50 MCG SPR: 50 | 30 days supply | Qty: 16 | Fill #0

## 2019-08-29 NOTE — Progress Notes (Signed)

## 2019-11-15 ENCOUNTER — Ambulatory Visit: Payer: No Typology Code available for payment source | Admitting: Physician Assistant

## 2020-01-05 IMAGING — [facility] DIGITAL SCREENING BILATERAL MAMMOGRAM WITH IMPLANTS, CAD AND TOM
9 of 12 series · 9 of 28 positions shown · non-contrast
Comparison: Previous exam(s).

CLINICAL DATA: Screening.

EXAM:
DIGITAL SCREENING BILATERAL MAMMOGRAM WITH IMPLANTS, CAD AND TOMO
The patient has retropectoral implants. Standard and implant
displaced views were performed.

[R CC]
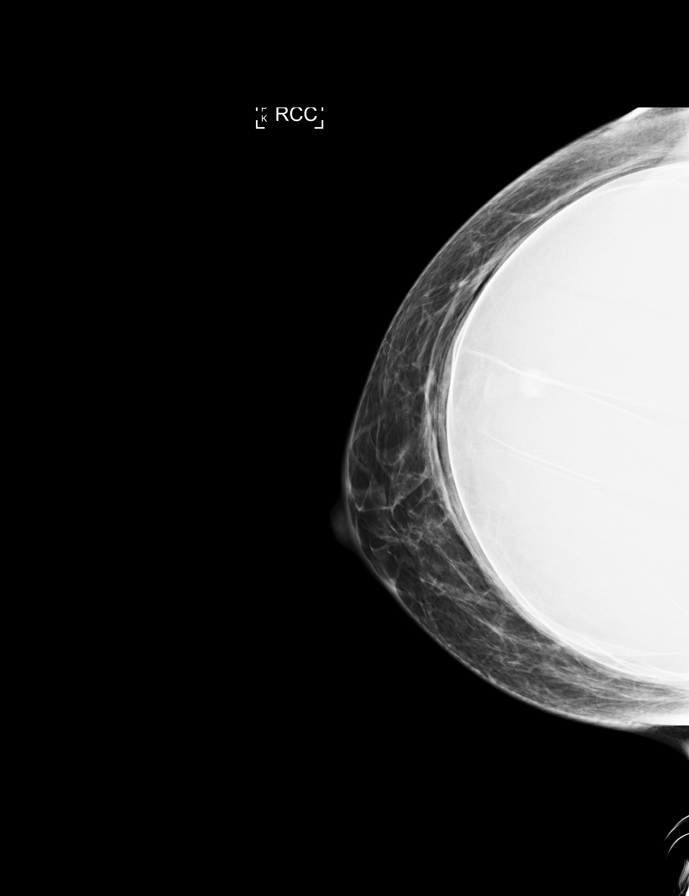

[L CC]
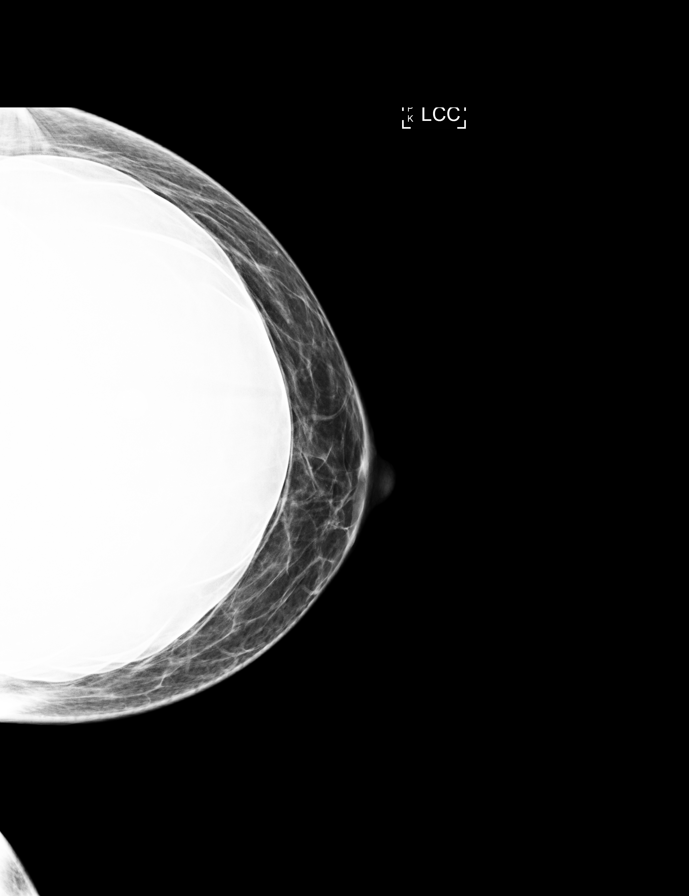

[L MLO]
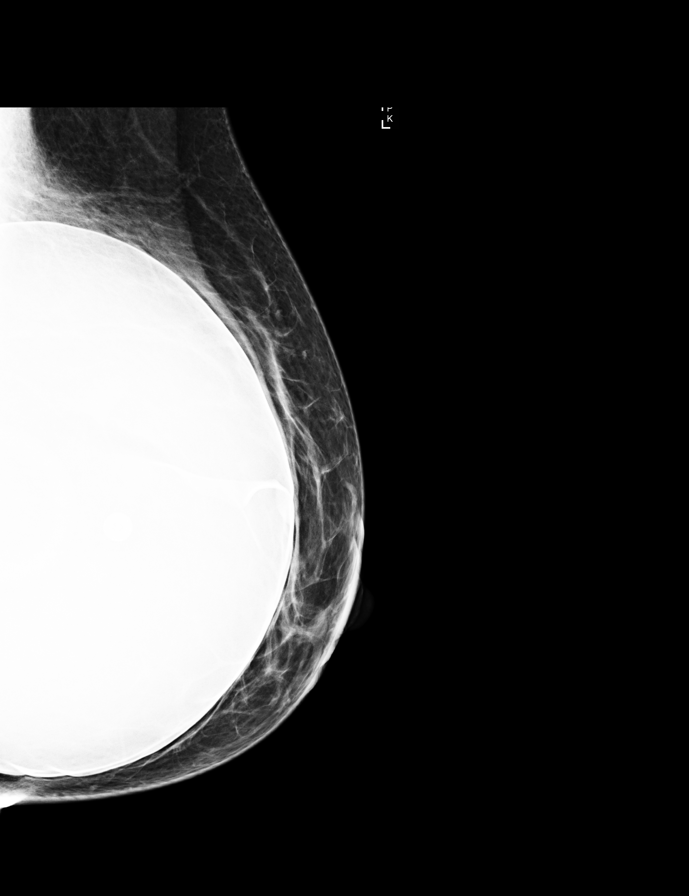

[R MLO]
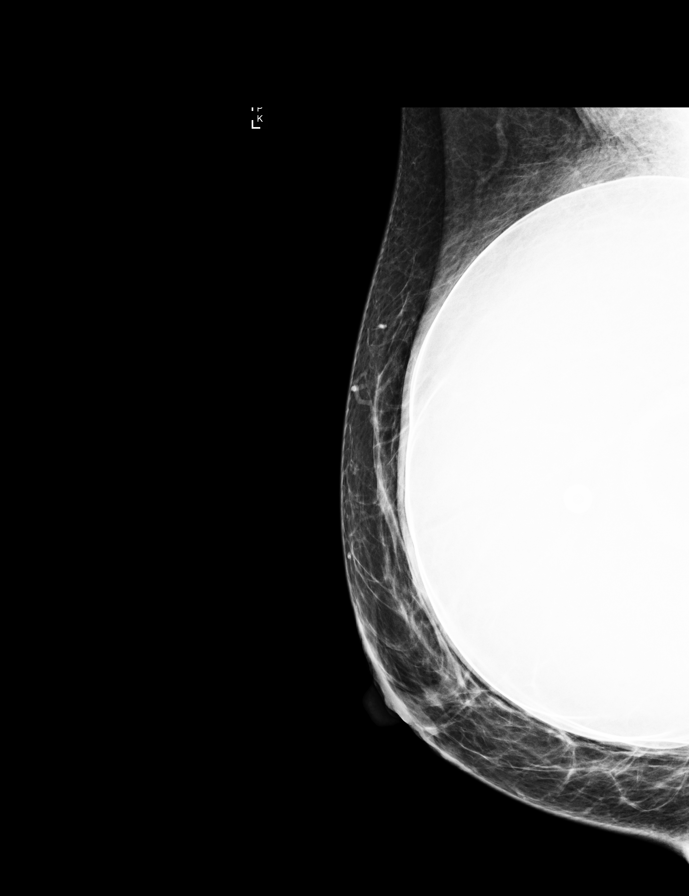

[R MLO synth-2D]
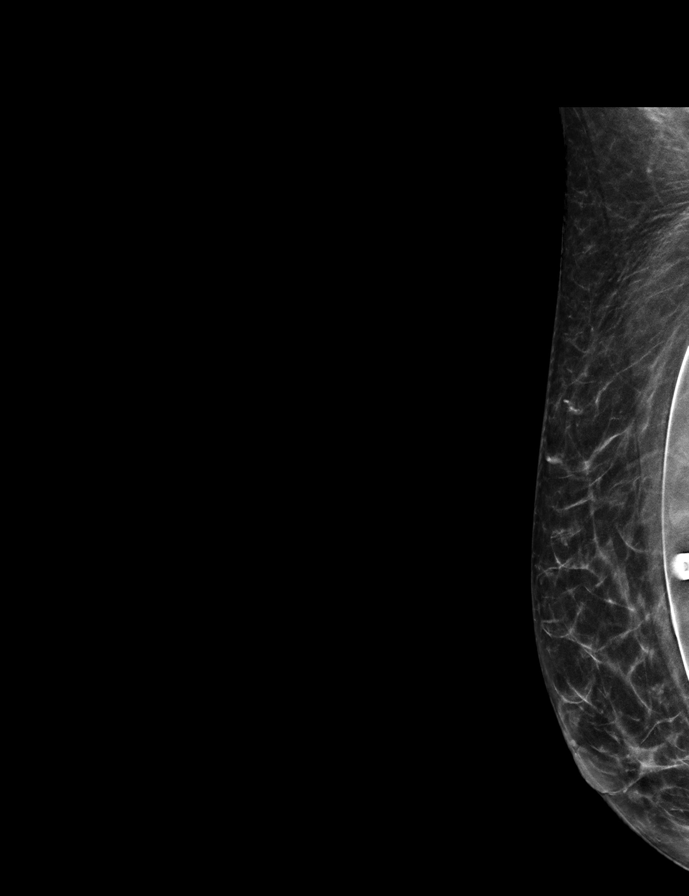

[L CC synth-2D]
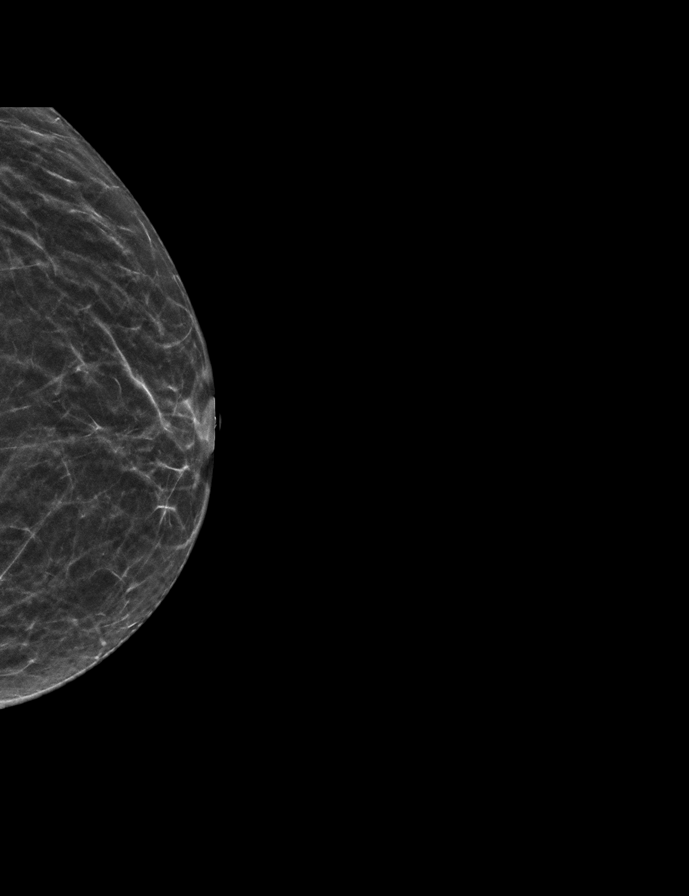

[L MLO synth-2D]
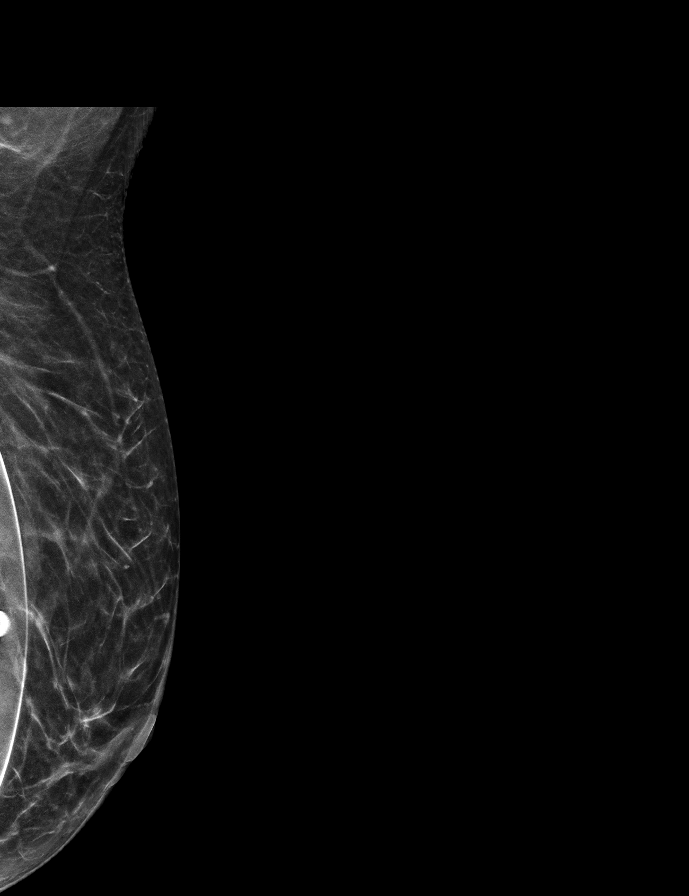

[R CC synth-2D]
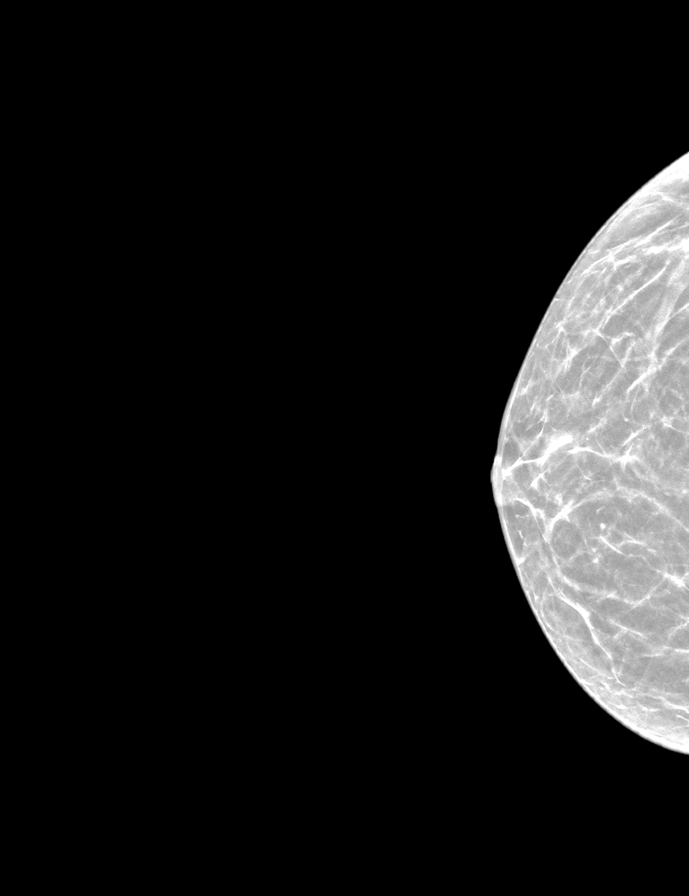

[R CCID BREAST TOMOSYNTHESIS IMAGE tomo · tomo slice 19/36.0]
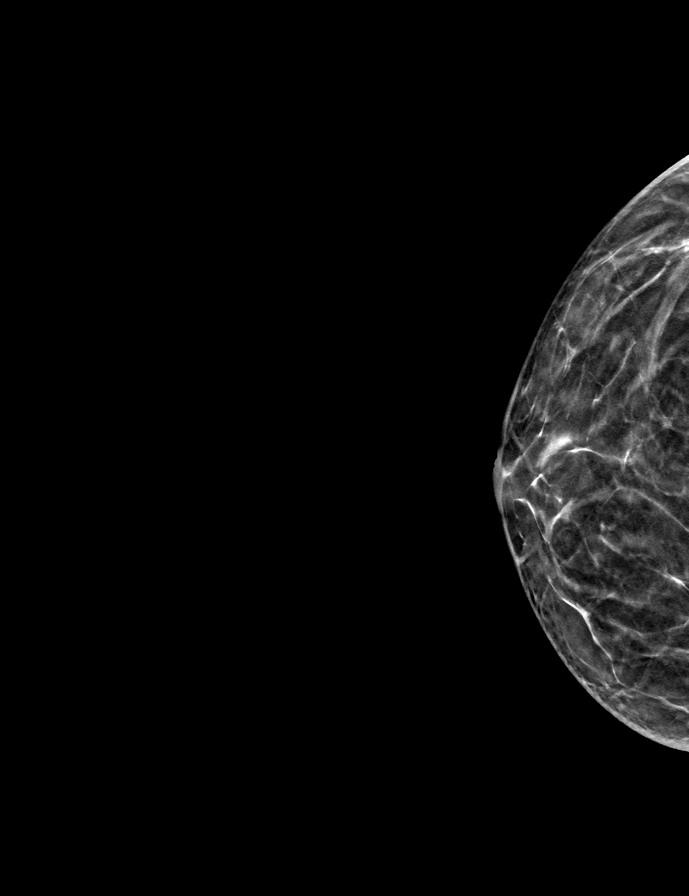

[9 of 28 positions shown; findings below may reference images not displayed]

ACR Breast Density Category b: There are scattered areas of
fibroglandular density.
FINDINGS: There are no findings suspicious for malignancy. Images were
processed with CAD.
IMPRESSION: No mammographic evidence of malignancy. A result letter of this
screening mammogram will be mailed directly to the patient.

RECOMMENDATION:
Screening mammogram in one year. (Code:[id number])

BI-RADS CATEGORY  1:  Negative.

## 2020-04-09 ENCOUNTER — Other Ambulatory Visit: Payer: Self-pay | Admitting: Family Medicine

## 2020-04-09 MED ORDER — VALACYCLOVIR HCL 1 G PO TABS: 2000.0000 mg | ORAL_TABLET | Freq: Two times a day (BID) | ORAL | 2 refills | Status: DC

## 2020-04-09 MED FILL — valACYclovir HCL 1 GM TABS: 1 | 15 days supply | Qty: 60 | Fill #0

## 2020-04-09 NOTE — Telephone Encounter (Signed)
Refill request for:  Valacyclovir 1000 mg  LR 03/13/19, #30 , 0 rf LOV 07/27/19 FOV  None scheduled.   Please review and advise.   Thanks. Dm/cma

## 2020-04-11 ENCOUNTER — Ambulatory Visit: Payer: No Typology Code available for payment source | Admitting: Physician Assistant

## 2021-02-12 ENCOUNTER — Other Ambulatory Visit (HOSPITAL_BASED_OUTPATIENT_CLINIC_OR_DEPARTMENT_OTHER): Payer: Self-pay

## 2021-02-12 MED FILL — Valacyclovir HCl Tab 1 GM: ORAL | 15 days supply | Qty: 60 | Fill #0 | Status: AC

## 2021-02-16 DIAGNOSIS — B009 Herpesviral infection, unspecified: Secondary | ICD-10-CM | POA: Insufficient documentation

## 2021-04-28 LAB — HM COLONOSCOPY

## 2022-03-19 LAB — RESULTS CONSOLE HPV
CHL HPV: NEGATIVE
CHL HPV: POSITIVE

## 2022-03-19 LAB — HM PAP SMEAR

## 2022-03-29 DIAGNOSIS — R87618 Other abnormal cytological findings on specimens from cervix uteri: Secondary | ICD-10-CM | POA: Insufficient documentation

## 2023-07-07 LAB — HM MAMMOGRAPHY

## 2023-09-27 ENCOUNTER — Other Ambulatory Visit: Payer: Self-pay | Admitting: Family Medicine

## 2023-09-27 ENCOUNTER — Ambulatory Visit (INDEPENDENT_AMBULATORY_CARE_PROVIDER_SITE_OTHER): Admitting: Family Medicine

## 2023-09-27 VITALS — BP 140/105 | HR 72 | Temp 97.9°F | Resp 99 | Ht 65.0 in | Wt 134.0 lb

## 2023-09-27 DIAGNOSIS — Z13220 Encounter for screening for lipoid disorders: Secondary | ICD-10-CM

## 2023-09-27 DIAGNOSIS — Z124 Encounter for screening for malignant neoplasm of cervix: Secondary | ICD-10-CM

## 2023-09-27 DIAGNOSIS — Z7689 Persons encountering health services in other specified circumstances: Secondary | ICD-10-CM | POA: Diagnosis not present

## 2023-09-27 DIAGNOSIS — Z0000 Encounter for general adult medical examination without abnormal findings: Secondary | ICD-10-CM | POA: Diagnosis not present

## 2023-09-27 DIAGNOSIS — R7302 Impaired glucose tolerance (oral): Secondary | ICD-10-CM | POA: Diagnosis not present

## 2023-09-27 DIAGNOSIS — Z136 Encounter for screening for cardiovascular disorders: Secondary | ICD-10-CM

## 2023-09-27 NOTE — Progress Notes (Signed)
 Complete physical exam and Establish Care  Patient: Alexandra Perez   DOB: 10-09-1970   53 y.o. Female  MRN: [id number]  Subjective:    Chief Complaint  Patient presents with   Annual Exam    Establish Care    Alexandra Perez is a 53 y.o. female who presents today for a complete physical exam as a new patient. She reports consuming a keto diet. Walk daily She generally feels well. She reports sleeping fairly well. She does not have additional problems to discuss today.  Last pap last year with +HPV. Needs repeating.  Most recent fall risk assessment:    09/27/2023    3:44 PM  Fall Risk   Falls in the past year? 0  Number falls in past yr: 0  Injury with Fall? 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   Past Medical History:  Diagnosis Date   Hypertension 2024   Past Surgical History:  Procedure Laterality Date   AUGMENTATION MAMMAPLASTY Bilateral 2016   CESAREAN SECTION     CHOLECYSTECTOMY  11-21-1995   CHOLECYSTECTOMY, LAPAROSCOPIC     COSMETIC SURGERY  2014   Breast augmentation   Social History   Socioeconomic History   Marital status: Married    Spouse name: Not on file   Number of children: 2   Years of education: Not on file   Highest education level: Bachelor's degree (e.g., BA, AB, BS)  Occupational History   Occupation: [employer]  Tobacco Use   Smoking status: Never    Passive exposure: Never   Smokeless tobacco: Never  Vaping Use   Vaping status: Never Used  Substance and Sexual Activity   Alcohol use: Yes    Alcohol/week: 6.0 - 8.0 standard drinks of alcohol    Types: 6 - 8 Glasses of wine per  week    Comment: social   Drug use: Never   Sexual activity: Yes    Partners: Male    Birth control/protection: None  Other Topics Concern   Not on file  Social History Narrative   Not on file   Social Drivers of Health   Financial Resource Strain: Low Risk  (09/27/2023)   Overall Financial Resource Strain (CARDIA)    Difficulty of Paying Living Expenses: Not very hard  Food Insecurity: No Food Insecurity (09/27/2023)   Hunger Vital Sign    Worried About Running Out of Food in the Last Year: Never true    Ran Out of Food in the Last Year: Never true  Transportation Needs: No Transportation Needs (09/27/2023)   PRAPARE - Administrator, Civil Service (Medical): No    Lack of Transportation (Non-Medical): No  Physical Activity: Sufficiently Active (09/27/2023)   Exercise Vital Sign    Days of Exercise per Week: 7 days    Minutes of Exercise per Session: 40 min  Stress: Stress Concern Present (09/27/2023)   [employer] of Occupational Health - Occupational Stress Questionnaire    Feeling of Stress: To some extent  Social Connections: Socially Isolated (09/27/2023)  Social Connection and Isolation Panel    Frequency of Communication with Friends and Family: Three times a week    Frequency of Social Gatherings with Friends and Family: Once a week    Attends Religious Services: Never    Database administrator or Organizations: No    Attends Engineer, structural: Not on file    Marital Status: Divorced  Intimate Partner Violence: Not At Risk (03/16/2022)   Received from [facility]   HITS    Over the last 12 months how often did your partner physically hurt you?: Never    Over the last 12 months how often did your partner insult you or talk down to you?: Never    Over the last 12 months how often did your partner threaten you with physical harm?: Never    Over the last 12 months how often did your partner scream or curse at you?: Never   Family History   Problem Relation Age of Onset   Hypertension Father    Cancer Father    COPD Father    Hypertension Brother    Cancer Maternal Grandmother    Cancer Maternal Grandfather        prostate   No Known Allergies    Patient Care Team: Colette Torrence GRADE, MD as PCP - General (Family Medicine)   Outpatient Medications Prior to Visit  Medication Sig   telmisartan (MICARDIS) 20 MG tablet Take 20 mg by mouth daily. (Patient not taking: Reported on 09/27/2023)   valACYclovir  (VALTREX ) 1000 MG tablet Take 1,000 mg by mouth daily. (Patient not taking: Reported on 09/27/2023)   [DISCONTINUED] benzonatate  (TESSALON  PERLES) 100 MG capsule Take 1-2 capsules (100-200 mg total) by mouth every 8 (eight) hours as needed for cough. (Patient not taking: Reported on 07/27/2019)   [DISCONTINUED] docusate sodium  (COLACE) 250 MG capsule Take 1 capsule (250 mg total) by mouth daily. (Patient not taking: Reported on 07/27/2019)   [DISCONTINUED] fluticasone  (FLONASE ) 50 MCG/ACT nasal spray Place 2 sprays into both nostrils daily.   No facility-administered medications prior to visit.    Review of Systems  All other systems reviewed and are negative.         Objective:     BP (!) 140/105   Pulse 72   Temp 97.9 F (36.6 C) (Oral)   Resp (!) 99   Ht 5' 5 (1.651 m)   Wt 134 lb (60.8 kg)   LMP 09/16/2023 (Exact Date)   BMI 22.30 kg/m  BP Readings from Last 3 Encounters:  09/27/23 (!) 140/105  07/27/19 128/78  04/18/18 128/84      Physical Exam Vitals and nursing note reviewed. Exam conducted with a chaperone present.  Constitutional:      Appearance: Normal appearance. She is normal weight.  HENT:     Head: Normocephalic and atraumatic.     Right Ear: Tympanic membrane, ear canal and external ear normal.     Left Ear: Tympanic membrane, ear canal and external ear normal.     Nose: Nose normal.     Mouth/Throat:     Mouth: Mucous membranes are moist.     Pharynx: Oropharynx is clear.   Eyes:     Conjunctiva/sclera: Conjunctivae normal.     Pupils: Pupils are equal, round, and reactive to light.  Cardiovascular:     Rate and Rhythm: Normal rate and regular rhythm.     Pulses: Normal pulses.     Heart sounds: Normal heart sounds.  Pulmonary:     Effort: Pulmonary effort is normal.     Breath sounds: Normal breath sounds.  Abdominal:     General: Abdomen is flat. Bowel sounds are normal.  Genitourinary:    General: Normal vulva.     Rectum: Normal.  Skin:    General: Skin is warm.     Capillary Refill: Capillary refill takes less than 2 seconds.  Neurological:     General: No focal deficit present.     Mental Status: She is alert and oriented to person, place, and time. Mental status is at baseline.  Psychiatric:        Mood and Affect: Mood normal.        Behavior: Behavior normal.        Thought Content: Thought content normal.        Judgment: Judgment normal.      Results for orders placed or performed in visit on 09/27/23  Results Console HPV  Result Value Ref Range   CHL HPV Positive   Results for orders placed or performed in visit on 09/27/23  HM MAMMOGRAPHY  Result Value Ref Range   HM Mammogram 0-4 Bi-Rad 0-4 Bi-Rad, Self Reported Normal  HM PAP SMEAR  Result Value Ref Range   HM Pap smear NILM   HM COLONOSCOPY  Result Value Ref Range   HM Colonoscopy See Report (in chart) See Report (in chart), Patient Reported   Last CBC Lab Results  Component Value Date   WBC 7.3 07/27/2019   HGB 13.2 07/27/2019   HCT 38.3 07/27/2019   MCV 93.0 07/27/2019   MCH 27.2 04/18/2018   RDW 12.7 07/27/2019   PLT 225.0 07/27/2019   Last metabolic panel Lab Results  Component Value Date   GLUCOSE 100 (H) 07/27/2019   NA 136 07/27/2019   K 4.3 07/27/2019   CL 104 07/27/2019   CO2 28 07/27/2019   BUN 12 07/27/2019   CREATININE 0.77 07/27/2019   GFR 80.43 07/27/2019   CALCIUM 9.3 07/27/2019   PROT 7.9 04/18/2018   ALBUMIN 4.4 04/18/2018   BILITOT  0.9 04/18/2018   ALKPHOS 43 04/18/2018   AST 22 07/27/2019   ALT 9 07/27/2019   ANIONGAP 10 04/18/2018   Last lipids Lab Results  Component Value Date   CHOL 166 07/27/2019   HDL 68.30 07/27/2019   LDLCALC 84 07/27/2019   TRIG 66.0 07/27/2019   CHOLHDL 2 07/27/2019   Last hemoglobin A1c No results found for: HGBA1C      Assessment & Plan:    Routine Health Maintenance and Physical Exam  Immunization History  Administered Date(s) Administered   Dtap, Unspecified 01/11/1973, 02/15/1973, 03/15/1973, 05/26/1974, 08/20/1977   Influenza Inj Mdck Quad Pf 01/06/2022   Influenza, Seasonal, Injecte, Preservative Fre 01/06/2011   Influenza,inj,Quad PF,6+ Mos 02/10/2012, 12/08/2019, 12/18/2020   Influenza-Unspecified 12/06/2017   MMR 03/14/2009, 04/30/2009   Measles 12/11/1973   Mumps 03/19/1974   PFIZER(Purple Top)SARS-COV-2 Vaccination 03/23/2019, 04/12/2019   PPD Test 04/16/2015, 04/29/2015, 04/20/2016   Polio, Unspecified 01/21/1973, 03/15/1973, 05/26/1974, 08/20/1977   Rubella 01/29/1974   Smallpox 06/28/1975   Td 03/14/2009, 06/06/2009   Tdap 02/27/2019   Varicella 04/30/2009    Health Maintenance  Topic Date Due   HIV Screening  Never done   Hepatitis C Screening  Never done   Hepatitis B Vaccines (1 of 3 - 19+ 3-dose series) Never done   Zoster Vaccines- Shingrix (1 of 2) Never done   COVID-19 Vaccine (3 - 2024-25  season) 11/07/2022   INFLUENZA VACCINE  10/07/2023   MAMMOGRAM  07/06/2025   Cervical Cancer Screening (HPV/Pap Cotest)  03/20/2027   DTaP/Tdap/Td (9 - Td or Tdap) 02/26/2029   Colonoscopy  04/29/2031   HPV VACCINES  Aged Out   Meningococcal B Vaccine  Aged Out    Discussed health benefits of physical activity, and encouraged her to engage in regular exercise appropriate for her age and condition.  Problem List Items Addressed This Visit   None Visit Diagnoses       Annual physical exam    -  Primary     Encounter to establish care with new  doctor         Encounter for lipid screening for cardiovascular disease       Relevant Orders   Lipid panel     Impaired glucose tolerance       Relevant Orders   CBC with Differential/Platelet   Comprehensive metabolic panel with GFR   Hemoglobin A1c     Screening for cervical cancer       Relevant Orders   IGP, Aptima HPV      Return in about 2 weeks (around 10/11/2023) for Hypertension. Annual physical exam  Encounter to establish care with new doctor  Encounter for lipid screening for cardiovascular disease -     Lipid panel  Impaired glucose tolerance -     CBC with Differential/Platelet -     Comprehensive metabolic panel with GFR -     Hemoglobin A1c  Screening for cervical cancer -     IGP, Aptima HPV   Screening labs to return when fasting Repeat pap today due to + HPV pap last year     Torrence CINDERELLA Barrier, MD

## 2023-09-28 DIAGNOSIS — R7302 Impaired glucose tolerance (oral): Secondary | ICD-10-CM | POA: Diagnosis not present

## 2023-09-28 DIAGNOSIS — Z136 Encounter for screening for cardiovascular disorders: Secondary | ICD-10-CM | POA: Diagnosis not present

## 2023-09-28 DIAGNOSIS — Z13220 Encounter for screening for lipoid disorders: Secondary | ICD-10-CM | POA: Diagnosis not present

## 2023-09-29 LAB — LIPID PANEL
Chol/HDL Ratio: 2.2 ratio (ref 0.0–4.4)
Cholesterol, Total: 251 mg/dL — ABNORMAL HIGH (ref 100–199)
HDL: 112 mg/dL (ref >39)
LDL Chol Calc (NIH): 127 mg/dL — ABNORMAL HIGH (ref 0–99)
Triglycerides: 73 mg/dL (ref 0–149)
VLDL Cholesterol Cal: 12 mg/dL (ref 5–40)

## 2023-09-29 LAB — CBC WITH DIFFERENTIAL/PLATELET
Basophils Absolute: 0.1 x10E3/uL (ref 0.0–0.2)
Basos: 1 %
EOS (ABSOLUTE): 0.1 x10E3/uL (ref 0.0–0.4)
Eos: 3 %
Hematocrit: 42.2 % (ref 34.0–46.6)
Hemoglobin: 13.5 g/dL (ref 11.1–15.9)
Immature Grans (Abs): 0 x10E3/uL (ref 0.0–0.1)
Immature Granulocytes: 0 %
Lymphocytes Absolute: 2 x10E3/uL (ref 0.7–3.1)
Lymphs: 42 %
MCH: 29.8 pg (ref 26.6–33.0)
MCHC: 32 g/dL (ref 31.5–35.7)
MCV: 93 fL (ref 79–97)
Monocytes Absolute: 0.3 x10E3/uL (ref 0.1–0.9)
Monocytes: 6 %
Neutrophils Absolute: 2.3 x10E3/uL (ref 1.4–7.0)
Neutrophils: 48 %
Platelets: 258 x10E3/uL (ref 150–450)
RBC: 4.53 x10E6/uL (ref 3.77–5.28)
RDW: 13.2 % (ref 11.7–15.4)
WBC: 4.7 x10E3/uL (ref 3.4–10.8)

## 2023-09-29 LAB — COMPREHENSIVE METABOLIC PANEL WITH GFR
ALT: 25 IU/L (ref 0–32)
AST: 30 IU/L (ref 0–40)
Albumin: 5 g/dL — ABNORMAL HIGH (ref 3.9–4.9)
Alkaline Phosphatase: 58 IU/L (ref 44–121)
BUN/Creatinine Ratio: 19 (ref 9–23)
BUN: 14 mg/dL (ref 6–24)
Bilirubin Total: 0.6 mg/dL (ref 0.0–1.2)
CO2: 19 mmol/L — ABNORMAL LOW (ref 20–29)
Calcium: 9.8 mg/dL (ref 8.7–10.2)
Chloride: 96 mmol/L (ref 96–106)
Creatinine, Ser: 0.73 mg/dL (ref 0.57–1.00)
Globulin, Total: 2.8 g/dL (ref 1.5–4.5)
Glucose: 73 mg/dL (ref 70–99)
Potassium: 4.6 mmol/L (ref 3.5–5.2)
Sodium: 137 mmol/L (ref 134–144)
Total Protein: 7.8 g/dL (ref 6.0–8.5)
eGFR: 100 mL/min/1.73 (ref >59)

## 2023-09-29 LAB — IGP, APTIMA HPV
HPV Aptima: POSITIVE — AB
PAP Smear Comment: 0

## 2023-09-29 LAB — HEMOGLOBIN A1C
Est. average glucose Bld gHb Est-mCnc: 97 mg/dL
Hgb A1c MFr Bld: 5 % (ref 4.8–5.6)

## 2023-09-30 ENCOUNTER — Ambulatory Visit: Payer: Self-pay | Admitting: Family Medicine

## 2023-09-30 DIAGNOSIS — R87810 Cervical high risk human papillomavirus (HPV) DNA test positive: Secondary | ICD-10-CM

## 2023-10-17 ENCOUNTER — Encounter: Payer: Self-pay | Admitting: Family Medicine

## 2023-10-17 ENCOUNTER — Ambulatory Visit: Admitting: Family Medicine

## 2023-10-17 VITALS — BP 124/88 | HR 65 | Temp 97.8°F | Resp 18 | Ht 65.0 in | Wt 136.1 lb

## 2023-10-17 DIAGNOSIS — R030 Elevated blood-pressure reading, without diagnosis of hypertension: Secondary | ICD-10-CM

## 2023-10-17 NOTE — Progress Notes (Addendum)
   Established Patient Office Visit  Subjective   Patient ID: Alexandra Perez, female    DOB: 07-22-1970  Age: 53 y.o. MRN: [id number]  Chief Complaint  Patient presents with   Follow-up    Patient is here for a 2 week follow up for HTN    HPI  HTN Pt seen a few weeks ago during CPE. Noted to have elevated blood pressures. Reported being on BP medicines in the past but no longer taking. Pt has been monitoring bp's at home and has been good, all less than SBP 130s.    Review of Systems  All other systems reviewed and are negative.     Objective:     BP 124/88   Pulse 65   Temp 97.8 F (36.6 C) (Oral)   Resp 18   Ht 5' 5 (1.651 m)   Wt 136 lb 1.6 oz (61.7 kg)   LMP 09/16/2023 (Exact Date)   SpO2 100%   BMI 22.65 kg/m  BP Readings from Last 3 Encounters:  10/17/23 124/88  09/27/23 (!) 140/105  07/27/19 128/78      Physical Exam Vitals and nursing note reviewed.  Constitutional:      Appearance: Normal appearance. She is normal weight.  HENT:     Head: Normocephalic and atraumatic.     Right Ear: External ear normal.     Left Ear: External ear normal.     Nose: Nose normal.     Mouth/Throat:     Mouth: Mucous membranes are moist.     Pharynx: Oropharynx is clear.  Eyes:     Conjunctiva/sclera: Conjunctivae normal.     Pupils: Pupils are equal, round, and reactive to light.  Cardiovascular:     Rate and Rhythm: Normal rate.  Pulmonary:     Effort: Pulmonary effort is normal.  Neurological:     Mental Status: She is alert and oriented to person, place, and time. Mental status is at baseline.  Psychiatric:        Mood and Affect: Mood normal.        Behavior: Behavior normal.        Thought Content: Thought content normal.        Judgment: Judgment normal.      No results found for any visits on 10/17/23.     The ASCVD Risk score (Arnett DK, et al., 2019) failed to calculate for the following reasons:   The valid HDL cholesterol range is 20 to  100 mg/dL    Assessment & Plan:   Problem List Items Addressed This Visit   None Visit Diagnoses       White coat syndrome without diagnosis of hypertension    -  Primary      White coat syndrome without diagnosis of hypertension   Pt with elevated BP in the office. Has been checking and all in normal range. To monitor for now, no medication indicated. Pt likely has white coat syndrome.   No follow-ups on file.    Torrence CINDERELLA Barrier, MD

## 2023-10-18 ENCOUNTER — Encounter: Payer: Self-pay | Admitting: Family Medicine

## 2023-10-18 ENCOUNTER — Other Ambulatory Visit (HOSPITAL_BASED_OUTPATIENT_CLINIC_OR_DEPARTMENT_OTHER): Payer: Self-pay

## 2023-10-18 MED ORDER — TELMISARTAN 20 MG PO TABS
20.0000 mg | ORAL_TABLET | Freq: Every day | ORAL | 1 refills | Status: AC
  Filled 2023-10-18 – 2023-10-28 (×2): qty 90, 90d supply, fill #0
  Filled 2024-04-10: qty 90, 90d supply, fill #1

## 2023-10-28 ENCOUNTER — Other Ambulatory Visit (HOSPITAL_BASED_OUTPATIENT_CLINIC_OR_DEPARTMENT_OTHER): Payer: Self-pay

## 2024-01-18 ENCOUNTER — Telehealth: Payer: Self-pay

## 2024-01-18 NOTE — Telephone Encounter (Signed)
 Contacted patient to schedule colposcopy per Dr. Abigail. Left voicemail for patient to call back.

## 2024-03-14 ENCOUNTER — Ambulatory Visit: Admitting: Obstetrics and Gynecology

## 2024-03-14 ENCOUNTER — Other Ambulatory Visit (HOSPITAL_COMMUNITY)
Admission: RE | Admit: 2024-03-14 | Discharge: 2024-03-14 | Disposition: A | Source: Ambulatory Visit | Attending: Obstetrics and Gynecology | Admitting: Obstetrics and Gynecology

## 2024-03-14 VITALS — BP 146/96 | HR 74 | Ht 65.0 in | Wt 138.0 lb

## 2024-03-14 DIAGNOSIS — I10 Essential (primary) hypertension: Secondary | ICD-10-CM | POA: Diagnosis not present

## 2024-03-14 DIAGNOSIS — Z3202 Encounter for pregnancy test, result negative: Secondary | ICD-10-CM

## 2024-03-14 DIAGNOSIS — R87618 Other abnormal cytological findings on specimens from cervix uteri: Secondary | ICD-10-CM | POA: Diagnosis present

## 2024-03-14 DIAGNOSIS — N912 Amenorrhea, unspecified: Secondary | ICD-10-CM

## 2024-03-14 DIAGNOSIS — R87810 Cervical high risk human papillomavirus (HPV) DNA test positive: Secondary | ICD-10-CM | POA: Diagnosis not present

## 2024-03-14 LAB — POCT URINE PREGNANCY: Preg Test, Ur: NEGATIVE

## 2024-03-14 NOTE — Progress Notes (Signed)
 "  NEW GYNECOLOGY VISIT Chief Complaint  Patient presents with   New Gyn    Colposcopy     Subjective:  Alexandra Perez is a 54 y.o. who presents for new patient for abnormal pap smear  Pap history: 09/2023: NILM/HPV positive (not specified) 03/2022: NILM/HPV positive (other)  Reports these are her first abnormal pap smears apart from maybe >20 years ago. Has had regular pap smears through her life No hx cervical procedures Transitioning into menopause, LMP 08/2023  Has hx hypertension, took her meds today  OB History   No obstetric history on file.     Past Medical History:  Diagnosis Date   Hypertension 2024    Past Surgical History:  Procedure Laterality Date   AUGMENTATION MAMMAPLASTY Bilateral 2016   CESAREAN SECTION     CHOLECYSTECTOMY  11-21-1995   CHOLECYSTECTOMY, LAPAROSCOPIC     COSMETIC SURGERY  2014   Breast augmentation    Social History   Socioeconomic History   Marital status: Married    Spouse name: Not on file   Number of children: 2   Years of education: Not on file   Highest education level: Bachelor's degree (e.g., BA, AB, BS)  Occupational History   Occupation: [employer]  Tobacco Use   Smoking status: Never    Passive exposure: Never   Smokeless tobacco: Never  Vaping Use   Vaping status: Never Used  Substance and Sexual Activity   Alcohol use: Yes    Alcohol/week: 6.0 - 8.0 standard drinks of alcohol    Types: 6 - 8 Glasses of wine per week    Comment: social   Drug use: Never   Sexual activity: Yes    Partners: Male    Birth control/protection: None  Other Topics Concern   Not on file  Social History Narrative   Not on file   Social Drivers of Health   Tobacco Use: Low Risk (10/17/2023)   Patient History    Smoking Tobacco Use: Never    Smokeless Tobacco Use: Never    Passive Exposure: Never  Financial Resource Strain: Low Risk (09/27/2023)   Overall Financial Resource Strain (CARDIA)    Difficulty of Paying Living  Expenses: Not very hard  Food Insecurity: No Food Insecurity (09/27/2023)   Epic    Worried About Radiation Protection Practitioner of Food in the Last Year: Never true    Ran Out of Food in the Last Year: Never true  Transportation Needs: No Transportation Needs (09/27/2023)   Epic    Lack of Transportation (Medical): No    Lack of Transportation (Non-Medical): No  Physical Activity: Sufficiently Active (09/27/2023)   Exercise Vital Sign    Days of Exercise per Week: 7 days    Minutes of Exercise per Session: 40 min  Stress: Stress Concern Present (09/27/2023)   [employer] of Occupational Health - Occupational Stress Questionnaire    Feeling of Stress: To some extent  Social Connections: Socially Isolated (09/27/2023)   Social Connection and Isolation Panel    Frequency of Communication with Friends and Family: Three times a week    Frequency of Social Gatherings with Friends and Family: Once a week    Attends Religious Services: Never    Database Administrator or Organizations: No    Attends Banker Meetings: Not on file    Marital Status: Divorced  Depression (PHQ2-9): Low Risk (09/27/2023)   Depression (PHQ2-9)    PHQ-2 Score: 1  Alcohol Screen: Low Risk (  09/27/2023)   Alcohol Screen    Last Alcohol Screening Score (AUDIT): 4  Housing: Low Risk (09/27/2023)   Epic    Unable to Pay for Housing in the Last Year: No    Number of Times Moved in the Last Year: 1    Homeless in the Last Year: No  Utilities: Not At Risk (03/16/2022)   Received from [facility]    Threatened with loss of utilities: No  Health Literacy: Adequate Health Literacy (09/27/2023)   B1300 Health Literacy    Frequency of need for help with medical instructions: Never    Family History  Problem Relation Age of Onset   Hypertension Father    Cancer Father    COPD Father    Hypertension Brother    Cancer Maternal Grandmother    Cancer Maternal Grandfather        prostate    Medications  Ordered Prior to Encounter[1]  Allergies[2]   Objective:   Vitals:   03/14/24 1345 03/14/24 1457 03/14/24 1510 03/14/24 1512  BP: (!) 174/83 (!) 164/93 (!) 159/94 (!) 146/96  Pulse: 62 66 74   Weight:      Height:        GYNECOLOGY OFFICE COLPOSCOPY PROCEDURE NOTE  Reviewed the risks of pain, bleeding, infection, inadequate sample. Patient gave informed written consent, time out was performed.  Placed in lithotomy position. Cervix viewed with speculum and colposcope after application of acetic acid.   Colposcopy adequate? Yes No visible lesions. No cervical biopsies taken.  ECC specimen obtained. All specimens were labeled and sent to pathology.  Chaperone was present during entire procedure.  Patient was given post procedure instructions.  Will follow up pathology and manage accordingly; patient will be contacted with results and recommendations.      Assessment and Plan:  1. Pap smear abnormality of cervix/human papillomavirus (HPV) positive (Primary) Patient counseled on pap smear screening and HPV ECC obtained Suspect will be normal or low grade Likely will need pap again in 1 year but will contact with results - POCT urine pregnancy - Surgical pathology( [place]/ [facility])  2. Amenorrhea UPT neg - POCT urine pregnancy  3. Hypertension, unspecified type Advised to monitor BP at home and f/u with PCP as needed  Rollo [name], MD, FACOG Obstetrician & Gynecologist, [facility] for [address] [facility], [facility]    [1]  Current Outpatient Medications on File Prior to Visit  Medication Sig Dispense Refill   telmisartan  (MICARDIS ) 20 MG tablet Take 1 tablet (20 mg total) by mouth daily. 90 tablet 1   No current facility-administered medications on file prior to visit.  [2] No Known Allergies  "

## 2024-03-19 LAB — SURGICAL PATHOLOGY

## 2024-03-21 ENCOUNTER — Ambulatory Visit: Payer: Self-pay | Admitting: Obstetrics and Gynecology

## 2024-04-10 ENCOUNTER — Other Ambulatory Visit (HOSPITAL_BASED_OUTPATIENT_CLINIC_OR_DEPARTMENT_OTHER): Payer: Self-pay
